# Patient Record
Sex: Female | Born: 1975
Health system: Southern US, Community
[De-identification: ages and names within clinical notes are randomized; demographics above are authoritative.]

## PROBLEM LIST (undated history)

## (undated) DIAGNOSIS — R413 Other amnesia: Secondary | ICD-10-CM

## (undated) DIAGNOSIS — R7303 Prediabetes: Secondary | ICD-10-CM

## (undated) DIAGNOSIS — S069X9A Unspecified intracranial injury with loss of consciousness of unspecified duration, initial encounter: Secondary | ICD-10-CM

## (undated) DIAGNOSIS — R51 Headache: Secondary | ICD-10-CM

## (undated) DIAGNOSIS — Z973 Presence of spectacles and contact lenses: Secondary | ICD-10-CM

## (undated) DIAGNOSIS — J45909 Unspecified asthma, uncomplicated: Secondary | ICD-10-CM

## (undated) DIAGNOSIS — R519 Headache, unspecified: Secondary | ICD-10-CM

## (undated) DIAGNOSIS — B977 Papillomavirus as the cause of diseases classified elsewhere: Secondary | ICD-10-CM

## (undated) DIAGNOSIS — K589 Irritable bowel syndrome without diarrhea: Secondary | ICD-10-CM

## (undated) DIAGNOSIS — J302 Other seasonal allergic rhinitis: Secondary | ICD-10-CM

## (undated) DIAGNOSIS — Z87820 Personal history of traumatic brain injury: Secondary | ICD-10-CM

## (undated) DIAGNOSIS — G8929 Other chronic pain: Secondary | ICD-10-CM

## (undated) DIAGNOSIS — Z9289 Personal history of other medical treatment: Secondary | ICD-10-CM

## (undated) DIAGNOSIS — M2550 Pain in unspecified joint: Secondary | ICD-10-CM

## (undated) DIAGNOSIS — Z972 Presence of dental prosthetic device (complete) (partial): Secondary | ICD-10-CM

## (undated) DIAGNOSIS — Z95828 Presence of other vascular implants and grafts: Secondary | ICD-10-CM

## (undated) DIAGNOSIS — Z01419 Encounter for gynecological examination (general) (routine) without abnormal findings: Secondary | ICD-10-CM

## (undated) HISTORY — DX: Headache, unspecified: R51.9

## (undated) HISTORY — DX: Other amnesia: R41.3

## (undated) HISTORY — DX: Papillomavirus as the cause of diseases classified elsewhere: B97.7

## (undated) HISTORY — PX: KNEE SURGERY: SHX244

## (undated) HISTORY — PX: COLONOSCOPY: SHX174

## (undated) HISTORY — DX: Personal history of other medical treatment: Z92.89

## (undated) HISTORY — PX: FINGER SURGERY: SHX640

## (undated) HISTORY — DX: Presence of dental prosthetic device (complete) (partial): Z97.2

## (undated) HISTORY — DX: Headache: R51

## (undated) HISTORY — DX: Other chronic pain: G89.29

## (undated) HISTORY — DX: Unspecified asthma, uncomplicated: J45.909

## (undated) HISTORY — DX: Pain in unspecified joint: M25.50

## (undated) HISTORY — DX: Unspecified intracranial injury with loss of consciousness of unspecified duration, initial encounter: S06.9X9A

## (undated) HISTORY — DX: Presence of other vascular implants and grafts: Z95.828

## (undated) HISTORY — DX: Other seasonal allergic rhinitis: J30.2

## (undated) HISTORY — DX: Irritable bowel syndrome, unspecified: K58.9

## (undated) HISTORY — DX: Encounter for gynecological examination (general) (routine) without abnormal findings: Z01.419

## (undated) HISTORY — DX: Personal history of traumatic brain injury: Z87.820

## (undated) HISTORY — DX: Presence of spectacles and contact lenses: Z97.3

---

## 2005-09-14 DIAGNOSIS — S069XAA Unspecified intracranial injury with loss of consciousness status unknown, initial encounter: Secondary | ICD-10-CM

## 2005-09-14 DIAGNOSIS — S069X9A Unspecified intracranial injury with loss of consciousness of unspecified duration, initial encounter: Secondary | ICD-10-CM

## 2005-09-14 DIAGNOSIS — Z95828 Presence of other vascular implants and grafts: Secondary | ICD-10-CM

## 2005-09-14 HISTORY — DX: Unspecified intracranial injury with loss of consciousness status unknown, initial encounter: S06.9XAA

## 2005-09-14 HISTORY — DX: Unspecified intracranial injury with loss of consciousness of unspecified duration, initial encounter: S06.9X9A

## 2005-09-14 HISTORY — DX: Presence of other vascular implants and grafts: Z95.828

## 2007-02-21 ENCOUNTER — Other Ambulatory Visit: Admission: RE | Admit: 2007-02-21 | Discharge: 2007-02-21 | Payer: Self-pay | Admitting: Obstetrics and Gynecology

## 2007-07-15 ENCOUNTER — Encounter: Admission: RE | Admit: 2007-07-15 | Discharge: 2007-07-15 | Payer: Self-pay | Admitting: Family Medicine

## 2007-08-31 ENCOUNTER — Emergency Department (HOSPITAL_COMMUNITY): Admission: EM | Admit: 2007-08-31 | Discharge: 2007-08-31 | Payer: Self-pay | Admitting: *Deleted

## 2008-04-27 ENCOUNTER — Other Ambulatory Visit: Admission: RE | Admit: 2008-04-27 | Discharge: 2008-04-27 | Payer: Self-pay | Admitting: Obstetrics and Gynecology

## 2008-11-05 ENCOUNTER — Encounter: Admission: RE | Admit: 2008-11-05 | Discharge: 2009-02-03 | Payer: Self-pay | Admitting: Psychology

## 2009-05-10 ENCOUNTER — Other Ambulatory Visit: Admission: RE | Admit: 2009-05-10 | Discharge: 2009-05-10 | Payer: Self-pay | Admitting: Obstetrics and Gynecology

## 2009-06-29 ENCOUNTER — Emergency Department (HOSPITAL_COMMUNITY): Admission: EM | Admit: 2009-06-29 | Discharge: 2009-06-29 | Payer: Self-pay | Admitting: Emergency Medicine

## 2009-10-19 ENCOUNTER — Inpatient Hospital Stay (HOSPITAL_COMMUNITY): Admission: AD | Admit: 2009-10-19 | Discharge: 2009-10-19 | Payer: Self-pay | Admitting: Obstetrics and Gynecology

## 2009-11-23 ENCOUNTER — Encounter (INDEPENDENT_AMBULATORY_CARE_PROVIDER_SITE_OTHER): Payer: Self-pay | Admitting: Obstetrics and Gynecology

## 2009-11-23 ENCOUNTER — Inpatient Hospital Stay (HOSPITAL_COMMUNITY): Admission: AD | Admit: 2009-11-23 | Discharge: 2009-11-26 | Payer: Self-pay | Admitting: Obstetrics and Gynecology

## 2009-11-23 ENCOUNTER — Ambulatory Visit: Payer: Self-pay | Admitting: Obstetrics & Gynecology

## 2010-05-21 ENCOUNTER — Other Ambulatory Visit: Admission: RE | Admit: 2010-05-21 | Discharge: 2010-05-21 | Payer: Self-pay | Admitting: Obstetrics and Gynecology

## 2010-12-04 LAB — URINALYSIS, ROUTINE W REFLEX MICROSCOPIC
Bilirubin Urine: NEGATIVE
Ketones, ur: >80 mg/dL — AB

## 2010-12-08 LAB — CBC
HCT: 30 % — ABNORMAL LOW (ref 36.0–46.0)
HCT: 37 % (ref 36.0–46.0)
Hemoglobin: 12.3 g/dL (ref 12.0–15.0)
MCHC: 33.1 g/dL (ref 30.0–36.0)
MCHC: 33.2 g/dL (ref 30.0–36.0)
MCV: 91.7 fL (ref 78.0–100.0)
MCV: 92.2 fL (ref 78.0–100.0)
MCV: 92.3 fL (ref 78.0–100.0)
Platelets: 137 10*3/uL — ABNORMAL LOW (ref 150–400)
Platelets: 161 10*3/uL (ref 150–400)
RBC: 3.26 MIL/uL — ABNORMAL LOW (ref 3.87–5.11)
RBC: 4.04 MIL/uL (ref 3.87–5.11)
RDW: 15.2 % (ref 11.5–15.5)
RDW: 15.2 % (ref 11.5–15.5)
RDW: 15.5 % (ref 11.5–15.5)
WBC: 10.3 10*3/uL (ref 4.0–10.5)
WBC: 10.5 10*3/uL (ref 4.0–10.5)
WBC: 7.2 10*3/uL (ref 4.0–10.5)

## 2010-12-08 LAB — RPR: RPR Ser Ql: NONREACTIVE

## 2010-12-18 LAB — URINALYSIS, ROUTINE W REFLEX MICROSCOPIC
Glucose, UA: NEGATIVE mg/dL
Hgb urine dipstick: NEGATIVE
Ketones, ur: 40 mg/dL — AB
Protein, ur: NEGATIVE mg/dL
Urobilinogen, UA: 0.2 mg/dL (ref 0.0–1.0)
pH: 5 (ref 5.0–8.0)

## 2010-12-18 LAB — POCT I-STAT, CHEM 8
Chloride: 104 mEq/L (ref 96–112)
Creatinine, Ser: 0.8 mg/dL (ref 0.4–1.2)
Glucose, Bld: 82 mg/dL (ref 70–99)
Hemoglobin: 11.6 g/dL — ABNORMAL LOW (ref 12.0–15.0)
Sodium: 136 mEq/L (ref 135–145)

## 2011-02-05 ENCOUNTER — Other Ambulatory Visit: Payer: Self-pay | Admitting: Family Medicine

## 2011-02-05 DIAGNOSIS — T148XXA Other injury of unspecified body region, initial encounter: Secondary | ICD-10-CM

## 2011-02-06 ENCOUNTER — Other Ambulatory Visit: Payer: Self-pay

## 2011-02-10 ENCOUNTER — Ambulatory Visit
Admission: RE | Admit: 2011-02-10 | Discharge: 2011-02-10 | Disposition: A | Discharge status: Home / Self Care | Payer: 59 | Source: Ambulatory Visit | Attending: Family Medicine | Admitting: Family Medicine

## 2011-02-10 DIAGNOSIS — T148XXA Other injury of unspecified body region, initial encounter: Secondary | ICD-10-CM

## 2011-07-21 ENCOUNTER — Other Ambulatory Visit: Payer: Self-pay | Admitting: Obstetrics and Gynecology

## 2011-07-21 ENCOUNTER — Other Ambulatory Visit (HOSPITAL_COMMUNITY)
Admission: RE | Admit: 2011-07-21 | Discharge: 2011-07-21 | Disposition: A | Discharge status: Home / Self Care | Payer: 59 | Source: Ambulatory Visit | Attending: Obstetrics and Gynecology | Admitting: Obstetrics and Gynecology

## 2011-07-21 DIAGNOSIS — Z01419 Encounter for gynecological examination (general) (routine) without abnormal findings: Secondary | ICD-10-CM | POA: Insufficient documentation

## 2011-07-22 ENCOUNTER — Other Ambulatory Visit: Payer: Self-pay | Admitting: Obstetrics and Gynecology

## 2011-07-22 DIAGNOSIS — N6320 Unspecified lump in the left breast, unspecified quadrant: Secondary | ICD-10-CM

## 2011-07-22 DIAGNOSIS — N644 Mastodynia: Secondary | ICD-10-CM

## 2011-08-07 ENCOUNTER — Ambulatory Visit
Admission: RE | Admit: 2011-08-07 | Discharge: 2011-08-07 | Disposition: A | Discharge status: Home / Self Care | Payer: 59 | Source: Ambulatory Visit | Attending: Obstetrics and Gynecology | Admitting: Obstetrics and Gynecology

## 2011-08-07 DIAGNOSIS — N6320 Unspecified lump in the left breast, unspecified quadrant: Secondary | ICD-10-CM

## 2011-08-07 DIAGNOSIS — N644 Mastodynia: Secondary | ICD-10-CM

## 2012-01-20 ENCOUNTER — Ambulatory Visit (INDEPENDENT_AMBULATORY_CARE_PROVIDER_SITE_OTHER): Payer: 59 | Admitting: Family Medicine

## 2012-01-20 VITALS — BP 119/76 | HR 77 | Temp 98.5°F | Resp 16 | Ht 65.0 in | Wt 168.0 lb

## 2012-01-20 DIAGNOSIS — Z23 Encounter for immunization: Secondary | ICD-10-CM

## 2012-01-20 DIAGNOSIS — S61209A Unspecified open wound of unspecified finger without damage to nail, initial encounter: Secondary | ICD-10-CM

## 2012-01-20 NOTE — Patient Instructions (Signed)
Keep it clean and dry.  Tetanus shot (TDaP) given  Do not eat after midnight  Go to Dr. Glenna Durand office at [facility] at [facility] above the [employer].

## 2012-01-20 NOTE — Progress Notes (Signed)
Subjective: Patient was cutting chicken about 24 hours ago and cut her hand. She has a slice on the DIP joint at the extensor surface. It is clean and superficial. She is a full-time housewife. Last tetanus shot was 7 years ago.  Objective: 1.5 CM laceration  dorsum left index finger at DIP joint. The tip of the finger remains slightly flexed. She can weakly extend it, and has good flexion. It is not red or inflamed.  Assessment: Laceration left index finger, with damage to extensor tendon.   Plan: Due to the definite flexion deformity we will need to refer to hand ortho Spoke with Dr. Melvyn Novas.  She is to go to his office NPO at 9 AM. Tomorrow.

## 2012-05-19 ENCOUNTER — Encounter: Payer: Self-pay | Admitting: Medical

## 2012-05-19 ENCOUNTER — Ambulatory Visit (INDEPENDENT_AMBULATORY_CARE_PROVIDER_SITE_OTHER): Payer: 59 | Admitting: Medical

## 2012-05-19 VITALS — BP 102/80 | HR 68 | Temp 98.7°F | Resp 16 | Ht 64.0 in | Wt 160.0 lb

## 2012-05-19 DIAGNOSIS — D689 Coagulation defect, unspecified: Secondary | ICD-10-CM

## 2012-05-19 DIAGNOSIS — Z113 Encounter for screening for infections with a predominantly sexual mode of transmission: Secondary | ICD-10-CM

## 2012-05-19 DIAGNOSIS — S069X9A Unspecified intracranial injury with loss of consciousness of unspecified duration, initial encounter: Secondary | ICD-10-CM

## 2012-05-19 DIAGNOSIS — R413 Other amnesia: Secondary | ICD-10-CM

## 2012-05-19 DIAGNOSIS — Z23 Encounter for immunization: Secondary | ICD-10-CM

## 2012-05-19 DIAGNOSIS — Z0000 Encounter for general adult medical examination without abnormal findings: Secondary | ICD-10-CM

## 2012-05-19 DIAGNOSIS — F88 Other disorders of psychological development: Secondary | ICD-10-CM

## 2012-05-19 DIAGNOSIS — R209 Unspecified disturbances of skin sensation: Secondary | ICD-10-CM

## 2012-05-19 DIAGNOSIS — R252 Cramp and spasm: Secondary | ICD-10-CM

## 2012-05-19 LAB — POCT URINALYSIS DIPSTICK
Leukocytes, UA: NEGATIVE
Protein, UA: NEGATIVE
Spec Grav, UA: 1.02
Urobilinogen, UA: NEGATIVE

## 2012-05-19 LAB — CBC WITH DIFFERENTIAL/PLATELET
Basophils Absolute: 0 10*3/uL (ref 0.0–0.1)
Eosinophils Relative: 2 % (ref 0–5)
Lymphocytes Relative: 30 % (ref 12–46)
MCV: 87.9 fL (ref 78.0–100.0)
Neutrophils Relative %: 63 % (ref 43–77)
Platelets: 199 10*3/uL (ref 150–400)
RDW: 15 % (ref 11.5–15.5)
WBC: 7.8 10*3/uL (ref 4.0–10.5)
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
## 2012-05-20 LAB — COMPREHENSIVE METABOLIC PANEL
ALT: 16 U/L (ref 0–35)
Albumin: 4.7 g/dL (ref 3.5–5.2)
CO2: 28 mEq/L (ref 19–32)
Calcium: 9.5 mg/dL (ref 8.4–10.5)
Chloride: 104 mEq/L (ref 96–112)
Glucose, Bld: 80 mg/dL (ref 70–99)
Potassium: 4.1 mEq/L (ref 3.5–5.3)
Sodium: 140 mEq/L (ref 135–145)
Total Bilirubin: 0.4 mg/dL (ref 0.3–1.2)
Total Protein: 7.3 g/dL (ref 6.0–8.3)

## 2012-05-20 LAB — LIPID PANEL
LDL Cholesterol: 81 mg/dL (ref 0–99)
Triglycerides: 44 mg/dL (ref <150)

## 2012-05-20 LAB — GC/CHLAMYDIA PROBE AMP, URINE
Chlamydia, Swab/Urine, PCR: NEGATIVE
GC Probe Amp, Urine: NEGATIVE

## 2012-05-20 LAB — APTT: aPTT: 32 seconds (ref 24–37)

## 2012-05-20 LAB — HIV ANTIBODY (ROUTINE TESTING W REFLEX): HIV: NONREACTIVE

## 2012-05-20 LAB — TSH: TSH: 2.532 u[IU]/mL (ref 0.350–4.500)

## 2012-09-12 ENCOUNTER — Other Ambulatory Visit: Payer: Self-pay | Admitting: Obstetrics and Gynecology

## 2012-09-12 ENCOUNTER — Other Ambulatory Visit (HOSPITAL_COMMUNITY)
Admission: RE | Admit: 2012-09-12 | Discharge: 2012-09-12 | Disposition: A | Discharge status: Home / Self Care | Payer: 59 | Source: Ambulatory Visit | Attending: Obstetrics and Gynecology | Admitting: Obstetrics and Gynecology

## 2012-09-12 DIAGNOSIS — Z1151 Encounter for screening for human papillomavirus (HPV): Secondary | ICD-10-CM | POA: Insufficient documentation

## 2012-09-12 DIAGNOSIS — Z01419 Encounter for gynecological examination (general) (routine) without abnormal findings: Secondary | ICD-10-CM | POA: Insufficient documentation

## 2013-09-14 DIAGNOSIS — Z01419 Encounter for gynecological examination (general) (routine) without abnormal findings: Secondary | ICD-10-CM

## 2013-09-14 HISTORY — DX: Encounter for gynecological examination (general) (routine) without abnormal findings: Z01.419

## 2013-10-04 ENCOUNTER — Encounter: Payer: Self-pay | Admitting: Medical

## 2013-10-04 ENCOUNTER — Ambulatory Visit (INDEPENDENT_AMBULATORY_CARE_PROVIDER_SITE_OTHER): Payer: 59 | Admitting: Medical

## 2013-10-04 VITALS — BP 108/76 | HR 75 | Temp 98.2°F | Resp 16 | Ht 64.0 in | Wt 163.0 lb

## 2013-10-04 DIAGNOSIS — L659 Nonscarring hair loss, unspecified: Secondary | ICD-10-CM

## 2013-10-04 DIAGNOSIS — R635 Abnormal weight gain: Secondary | ICD-10-CM

## 2013-10-04 DIAGNOSIS — Q742 Other congenital malformations of lower limb(s), including pelvic girdle: Secondary | ICD-10-CM

## 2013-10-04 DIAGNOSIS — R5381 Other malaise: Secondary | ICD-10-CM

## 2013-10-04 DIAGNOSIS — R5383 Other fatigue: Secondary | ICD-10-CM

## 2013-10-04 LAB — HEMOGLOBIN A1C
HEMOGLOBIN A1C: 5.5 % (ref <5.7)
Mean Plasma Glucose: 111 mg/dL (ref <117)

## 2013-10-04 LAB — CBC
HCT: 39.4 % (ref 36.0–46.0)
Hemoglobin: 13 g/dL (ref 12.0–15.0)
MCH: 29.1 pg (ref 26.0–34.0)
MCHC: 33 g/dL (ref 30.0–36.0)
MCV: 88.3 fL (ref 78.0–100.0)
PLATELETS: 193 10*3/uL (ref 150–400)
RBC: 4.46 MIL/uL (ref 3.87–5.11)
RDW: 14.7 % (ref 11.5–15.5)
WBC: 8.7 10*3/uL (ref 4.0–10.5)

## 2013-10-04 NOTE — Progress Notes (Signed)
                                                             Subjective:  Tiffany Hurley is a 38 y.o. female who presents for concerns about weight gain and hair thinning. Her last visit was over year ago for physical.  She notes that she was down to 140 pounds 6 months ago and she was exercising eating healthy, however she is being back up to 163 pounds.  She feels like something is off hormonally, although she does admit to not exercising and eating more junk food of late. She also reports hair thinning, ongoing nipple discharge since the birth of her son.  She does have followup with her gynecologist in the next 2 weeks. She denies breast pain, no rash, no blood or pus in the breast discharge.  She does feel as though she is under stress. 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 She also has questions about her left toe anomaly. She had prior surgery for the left toe and since then has had thickening deformity.  No other aggravating or relieving factors.    No other c/o.  The following portions of the patient's history were reviewed and updated as appropriate: allergies, current medications, past family history, past medical history, past social history, past surgical history and problem list.  ROS Otherwise as in subjective above  Objective: Physical Exam  BP 108/76  Pulse 75  Temp(Src) 98.2 F (36.8 C)  Resp 16  Ht 5\' 4"  (1.626 m)  Wt 163 lb (73.936 kg)  BMI 27.97 kg/m2  SpO2 98%   General appearence: alert, no distress, WD/WN HEENT: normocephalic, sclerae anicteric, conjunctiva pink and moist, TMs pearly, nares patent, no discharge or erythema, pharynx normal Oral cavity: MMM, no lesions Neck: supple, no lymphadenopathy, no thyromegaly, no masses Heart: RRR, normal S1, S2, no murmurs Lungs: CTA bilaterally, no wheezes, rhonchi, or rales Abdomen: +bs, soft, non tender, non distended, no masses, no hepatomegaly, no splenomegaly Pulses: 2+  radial pulses, 2+ pedal pulses, normal cap refill MSK: left 5th toe with thickened volar surface, calloused tissue Ext: no edema   Assessment: Encounter Diagnoses  Name Primary?  . Weight gain, abnormal Yes  . Hair thinning   . Other malaise and fatigue   . Toe anomaly     Plan: We discussed her concerns.  Weight gain likely just due to lack of exercise and worse diet of late.  Hair thinning could be attributable to stress.  We discussed other possible causes.  We will check labs at her request today. I recommend she get back to exercising rarely, eating healthy and try to decrease down to the weight she was 6 months ago.  I recommend she call back to get recheck with her podiatrist regarding the toe anomaly.  Follow up: pending labs

## 2013-10-05 ENCOUNTER — Telehealth: Payer: Self-pay | Admitting: Internal Medicine

## 2013-10-05 LAB — COMPREHENSIVE METABOLIC PANEL
ALBUMIN: 4.7 g/dL (ref 3.5–5.2)
ALT: 15 U/L (ref 0–35)
AST: 17 U/L (ref 0–37)
Alkaline Phosphatase: 42 U/L (ref 39–117)
BUN: 16 mg/dL (ref 6–23)
CALCIUM: 9.7 mg/dL (ref 8.4–10.5)
CHLORIDE: 106 meq/L (ref 96–112)
CO2: 28 meq/L (ref 19–32)
CREATININE: 0.9 mg/dL (ref 0.50–1.10)
Glucose, Bld: 69 mg/dL — ABNORMAL LOW (ref 70–99)
POTASSIUM: 4.6 meq/L (ref 3.5–5.3)
Sodium: 141 mEq/L (ref 135–145)
Total Bilirubin: 0.3 mg/dL (ref 0.3–1.2)
Total Protein: 7.4 g/dL (ref 6.0–8.3)

## 2013-10-05 LAB — TSH: TSH: 2.681 u[IU]/mL (ref 0.350–4.500)

## 2013-10-05 LAB — T4, FREE: Free T4: 1.05 ng/dL (ref 0.80–1.80)

## 2013-10-05 LAB — PROLACTIN: Prolactin: 9.6 ng/mL

## 2013-10-05 NOTE — Telephone Encounter (Signed)
Pt notified that she can wait until next year for labs

## 2013-10-05 NOTE — Telephone Encounter (Signed)
We can skip a year as her lasts lipid panel was completely normal

## 2013-10-05 NOTE — Telephone Encounter (Signed)
Pt wants to know when she needs to come in to get her lipids checks. She was unable to get it done yesterday. Or does she just need to wait another year? Please advise pt

## 2013-10-24 ENCOUNTER — Other Ambulatory Visit: Payer: Self-pay | Admitting: Obstetrics and Gynecology

## 2013-10-24 ENCOUNTER — Other Ambulatory Visit (HOSPITAL_COMMUNITY)
Admission: RE | Admit: 2013-10-24 | Discharge: 2013-10-24 | Disposition: A | Discharge status: Home / Self Care | Payer: 59 | Source: Ambulatory Visit | Attending: Obstetrics and Gynecology | Admitting: Obstetrics and Gynecology

## 2013-10-24 DIAGNOSIS — Z01419 Encounter for gynecological examination (general) (routine) without abnormal findings: Secondary | ICD-10-CM | POA: Insufficient documentation

## 2013-10-24 DIAGNOSIS — Z113 Encounter for screening for infections with a predominantly sexual mode of transmission: Secondary | ICD-10-CM | POA: Insufficient documentation

## 2013-12-09 ENCOUNTER — Ambulatory Visit (INDEPENDENT_AMBULATORY_CARE_PROVIDER_SITE_OTHER): Payer: 59 | Admitting: Family Medicine

## 2013-12-09 ENCOUNTER — Ambulatory Visit: Payer: 59

## 2013-12-09 VITALS — BP 132/80 | HR 68 | Temp 97.9°F | Resp 16 | Ht 64.0 in | Wt 170.0 lb

## 2013-12-09 DIAGNOSIS — M542 Cervicalgia: Secondary | ICD-10-CM

## 2013-12-09 DIAGNOSIS — M791 Myalgia, unspecified site: Secondary | ICD-10-CM

## 2013-12-09 DIAGNOSIS — M25519 Pain in unspecified shoulder: Secondary | ICD-10-CM

## 2013-12-09 DIAGNOSIS — IMO0001 Reserved for inherently not codable concepts without codable children: Secondary | ICD-10-CM

## 2013-12-09 DIAGNOSIS — R252 Cramp and spasm: Secondary | ICD-10-CM

## 2013-12-09 LAB — CALCIUM: Calcium: 9.8 mg/dL (ref 8.4–10.5)

## 2013-12-09 LAB — POCT SEDIMENTATION RATE: POCT SED RATE: 12 mm/h (ref 0–22)

## 2013-12-09 LAB — CK: Total CK: 192 U/L — ABNORMAL HIGH (ref 7–177)

## 2013-12-09 MED ORDER — DICLOFENAC SODIUM 75 MG PO TBEC: 75.0000 mg | DELAYED_RELEASE_TABLET | Freq: Two times a day (BID) | ORAL | Status: DC

## 2013-12-09 MED ORDER — METAXALONE 800 MG PO TABS: ORAL_TABLET | ORAL | Status: DC
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
## 2013-12-09 NOTE — Patient Instructions (Addendum)
Take diclofenac one twice daily for pain and inflammation. Take with food  Take Skelaxin muscle relaxants one half to one 3 times daily if needed for muscle tightness or spasm  If not improving over the next couple of weeks we will need to recheck you  I will let you know if the radiologist sees anything differently in the films. I will also let you know the results of the labs in a few days

## 2013-12-22 ENCOUNTER — Ambulatory Visit (INDEPENDENT_AMBULATORY_CARE_PROVIDER_SITE_OTHER): Payer: 59 | Admitting: Family Medicine

## 2013-12-22 VITALS — BP 100/70 | HR 80

## 2013-12-22 DIAGNOSIS — M62838 Other muscle spasm: Secondary | ICD-10-CM

## 2013-12-22 NOTE — Progress Notes (Signed)
   Subjective:    Patient ID: Tiffany Hurley, female    DOB: 1976-08-25, 38 y.o.   MRN: [id number]  HPI  Ms. Tiffany Hurley is a 38 yo female who presents today due to ongoing muscle spasms.   Around 3 months ago the patient began to experience muscle cramps in varying muscle groups in her upper or lower extremity. The spasms feels like a sharp tight cramping sensation. Then, one week ago the patient presented to urgent care because the spasms had begun to occur in her neck and shoulder. At that time she was given two medications, one for her shoulder for pain and a muscle relaxant. Since beginning the muscle relaxant her spasms in her neck have worsened and increased in frequency and are now a nightly occurence. These spasms awaken her from sleep and gradually resolve with massage over the course of a few minutes. The spasms have also occurred when she is awake and in those cases she can feel a spasm coming on.  The patient has a long history of muscle cramps which began at the age of 38. However, from the age of 38 until 3 months ago the cramps had always been isolated to her legs and only occurred rarely. Concurrent with the changes in her symptoms, three months ago the patient and her husband experienced a particularly stressful period. Subsequent to this the patient also experienced a period of "feeling very down in the dumps." The patient notes that it is possible that some of her symptoms may be exacerbated by the stresses in her life and her stress as a mother.   The patient denies an abnormally colored urine, headaches, changes in vision, weakness, numbness, tingling, changes in mood, behavior, sleep or symptoms of lethargy or fatigue. The patient has had no major changes in diet or medications. Her cramps get a little better with movement and massage. The cramps are not associated with dehydration or her menses and the patient drinks around 80oz of water a day.   Review of Systems is negative  except per HPI.     Objective:   Physical Exam  Constitutional: Patient is oriented to person, place, and time and well-developed, well-nourished, and in no distress.  HENT: Head: Normocephalic and atraumatic. Mouth/Throat: Oropharynx is clear and moist.  Eyes: Conjunctivae and EOM are normal. Pupils are equal, round, and reactive to light.  Neck: Normal range of motion. Neck supple. No point tendernes present.  Cardiovascular: Normal rate, regular rhythm.  Pulmonary/Chest: Effort normal and breath sounds normal. Neurological: He is alert and oriented to person, place, and time.  Reflex Scores: Biceps, Tricep and Patellar reflexes were all 2+ and equal bilaterally.  Skin: Skin is warm and dry. No rash noted.  Psychiatric: Affect normal.     Assessment & Plan:  Muscle spasms of head and/or neck  discussed various options with her. Recommended a good multivitamin as well as extra vitamin D, taking time out for herself, exercises. She continues to have difficulty she is to return here for further evaluation and possible referral to rheumatology

## 2013-12-22 NOTE — Progress Notes (Signed)
   Subjective:    Patient ID: Tiffany Hurley, female    DOB: 02-03-1976, 38 y.o.   MRN: [id number]  HPI  Ms. [name] is a 38 yo female who presents today due to ongoing muscle spasms.   Around 3 months ago the patient began to experience muscle cramps in varying muscle groups in her upper or lower extremity. The spasms feels like a sharp tight cramping sensation. Then, one week ago the patient presented to urgent care because the spasms had begun to occur in her neck and shoulder. At that time she was given two medications, one for her shoulder for pain and a muscle relaxant. Since beginning the muscle relaxant her spasms in her neck have worsened and increased in frequency and are now a nightly occurence. These spasms awaken her from sleep and gradually resolve with massage over the course of a few minutes. The spasms have also occurred when she is awake and in those cases she can feel a spasm coming on.  The patient has a long history of muscle cramps which began at the age of 38. However, from the age of 38 until 3 months ago the cramps had always been isolated to her legs and only occurred rarely. Concurrent with the changes in her symptoms, three months ago the patient and her husband experienced a particularly stressful period. Subsequent to this the patient also experienced a period of "feeling very down in the dumps." The patient notes that it is possible that some of her symptoms may be exacerbated by the stresses in her life and her stress as a mother.   The patient denies an abnormally colored urine, headaches, changes in vision, weakness, numbness, tingling, changes in mood, behavior, sleep or symptoms of lethargy or fatigue. The patient has had no major changes in diet or medications. Her cramps get a little better with movement and massage. The cramps are not associated with dehydration or her menses and the patient drinks around 80oz of water a day.   Review of Systems is negative  except per HPI.     Objective:   Physical Exam  Constitutional: Patient is oriented to person, place, and time and well-developed, well-nourished, and in no distress.  HENT: Head: Normocephalic and atraumatic. Mouth/Throat: Oropharynx is clear and moist.  Eyes: Conjunctivae and EOM are normal. Pupils are equal, round, and reactive to light.  Neck: Normal range of motion. Neck supple. No point tendernes present.  Cardiovascular: Normal rate, regular rhythm.  Pulmonary/Chest: Effort normal and breath sounds normal. Neurological: He is alert and oriented to person, place, and time.  Reflex Scores: Biceps, Tricep and Patellar reflexes were all 2+ and equal bilaterally.  Skin: Skin is warm and dry. No rash noted.  Psychiatric: Affect normal.     Assessment & Plan:

## 2013-12-22 NOTE — Progress Notes (Deleted)
   Subjective:    Patient ID: Tiffany Hurley, female    DOB: March 19, 1976, 38 y.o.   MRN: [id number]  HPI    Review of Systems     Objective:   Physical Exam        Assessment & Plan:

## 2013-12-27 ENCOUNTER — Ambulatory Visit: Payer: 59 | Admitting: Medical

## 2013-12-29 ENCOUNTER — Ambulatory Visit: Payer: 59 | Admitting: Family Medicine

## 2014-04-18 ENCOUNTER — Ambulatory Visit
Admission: RE | Admit: 2014-04-18 | Discharge: 2014-04-18 | Disposition: A | Discharge status: Home / Self Care | Payer: 59 | Source: Ambulatory Visit | Attending: Medical | Admitting: Medical

## 2014-04-18 ENCOUNTER — Encounter: Payer: Self-pay | Admitting: Medical

## 2014-04-18 ENCOUNTER — Ambulatory Visit (INDEPENDENT_AMBULATORY_CARE_PROVIDER_SITE_OTHER): Payer: 59 | Admitting: Medical

## 2014-04-18 VITALS — BP 102/70 | HR 74 | Temp 99.2°F | Resp 16 | Wt 180.0 lb

## 2014-04-18 DIAGNOSIS — M2560 Stiffness of unspecified joint, not elsewhere classified: Secondary | ICD-10-CM

## 2014-04-18 DIAGNOSIS — M79644 Pain in right finger(s): Secondary | ICD-10-CM

## 2014-04-18 DIAGNOSIS — M25441 Effusion, right hand: Secondary | ICD-10-CM

## 2014-04-18 DIAGNOSIS — Y92009 Unspecified place in unspecified non-institutional (private) residence as the place of occurrence of the external cause: Secondary | ICD-10-CM

## 2014-04-18 DIAGNOSIS — R29898 Other symptoms and signs involving the musculoskeletal system: Secondary | ICD-10-CM

## 2014-04-18 DIAGNOSIS — M25449 Effusion, unspecified hand: Secondary | ICD-10-CM

## 2014-04-18 DIAGNOSIS — W19XXXA Unspecified fall, initial encounter: Secondary | ICD-10-CM

## 2014-04-18 DIAGNOSIS — M79609 Pain in unspecified limb: Secondary | ICD-10-CM

## 2014-04-18 LAB — CBC WITH DIFFERENTIAL/PLATELET
BASOS ABS: 0 10*3/uL (ref 0.0–0.1)
Basophils Relative: 0 % (ref 0–1)
Eosinophils Absolute: 0.2 10*3/uL (ref 0.0–0.7)
Eosinophils Relative: 3 % (ref 0–5)
HCT: 35.6 % — ABNORMAL LOW (ref 36.0–46.0)
Hemoglobin: 11.8 g/dL — ABNORMAL LOW (ref 12.0–15.0)
Lymphocytes Relative: 29 % (ref 12–46)
Lymphs Abs: 2.3 10*3/uL (ref 0.7–4.0)
MCH: 28.7 pg (ref 26.0–34.0)
MCHC: 33.1 g/dL (ref 30.0–36.0)
MCV: 86.6 fL (ref 78.0–100.0)
Monocytes Absolute: 0.4 10*3/uL (ref 0.1–1.0)
Monocytes Relative: 5 % (ref 3–12)
Neutro Abs: 4.9 10*3/uL (ref 1.7–7.7)
Neutrophils Relative %: 63 % (ref 43–77)
PLATELETS: 182 10*3/uL (ref 150–400)
RBC: 4.11 MIL/uL (ref 3.87–5.11)
RDW: 14.7 % (ref 11.5–15.5)
WBC: 7.8 10*3/uL (ref 4.0–10.5)

## 2014-04-18 LAB — SEDIMENTATION RATE: Sed Rate: 11 mm/hr (ref 0–22)

## 2014-04-18 LAB — URIC ACID: URIC ACID, SERUM: 4.3 mg/dL (ref 2.4–7.0)

## 2014-04-18 NOTE — Patient Instructions (Signed)
  Thank you for giving me the opportunity to serve you today.    Your diagnosis today includes: Encounter Diagnoses  Name Primary?  . Finger joint swelling, right Yes  . Finger pain, right   . Fall at home, initial encounter   . Range of motion deficit      Specific recommendations today include:  The cause of the swelling and pain is unclear, could be inflammation, could be gout, could be infection  I recommend you use ice pack 20 minutes at a time to the hand and elevate the hand  I recommend you use an over-the-counter hand/wrist splint for the next few days  Go for x-ray, we will call with x-ray and lab results  You may use over-the-counter ibuprofen, 4 tablets 3 times daily for pain and inflammation  If your white count is elevated, I may put you on antibiotic for possible infection  If worsening swelling, redness, fever, then let me know right away  Return pendign xray/labs.

## 2014-04-18 NOTE — Progress Notes (Signed)
Subjective: Here for right hand swelling and pain.  3 days ago awoke with painful Right middle finger that didn't seem to be able to move.  By the next day started swelling.  Now right hand is swelling more.  She does note that she fell August 2, tripped over stuff that was in 1 of her rooms in her house. She didn't recall pain or an obvious injury then, but thinking back it is the only thing that has been different. She denies any other injury, trauma, puncture wound, or bite.  She denies history of similar pain and swelling of the joint. She denies history of gout or reactive arthritis.  Denies fever, no nausea, no vomiting.  Denies vision changes.  Denies urinary changes, no concern for STD.  No other aggravating or relieving factors. No other complaint.  Review of Systems Constitutional: -fever, -chills, -sweats, -unexpected weight change,-fatigue ENT: -runny nose, -ear pain, -sore throat Cardiology:  -chest pain, -palpitations, -edema Respiratory: -cough, -shortness of breath, -wheezing Gastroenterology: -abdominal pain, -nausea, -vomiting, -diarrhea, -constipation  Hematology: -bleeding or bruising problems Ophthalmology: -vision changes Urology: -dysuria, -difficulty urinating, -hematuria, -urinary frequency, -urgency Neurology: -headache, -weakness, -tingling, -numbness     Objective: Gen:wd, wn, nad Skin: no erythema or ecchyomsmis, but the right hand is warm over the area of swelling MSK: Right middle finger with swelling at the MCP, decreased range of motion of that finger throughout, tender to touch all along the middle finger and 3rd  Metacarpal, pain in same finger with wrist ROM, but wrist, hand, arm otherwise nontender.  No other deformity or abnormality noted Hand/fingers otherwise neurovascularly intact    Assessment: Encounter Diagnoses  Name Primary?  . Finger joint swelling, right Yes  . Finger pain, right   . Fall at home, initial encounter   . Range of motion  deficit    Plan: We discussed her exam findings and symptoms. There is no obvious suggestion of reactive arthritis, she did have a fall with potential injury 3 days ago although she didn't have pain immediately. She will begin supportive care and we will call with results of x-ray and labs  Specific recommendations today include:  The cause of the swelling and pain is unclear, could be inflammation, could be gout, could be infection  I recommend you use ice pack 20 minutes at a time to the hand and elevate the hand  I recommend you use an over-the-counter hand/wrist splint for the next few days  Go for x-ray, we will call with x-ray and lab results  You may use over-the-counter ibuprofen, 4 tablets 3 times daily for pain and inflammation  If your white count is elevated, I may put you on antibiotic for possible infection  If worsening swelling, redness, fever, then let me know right away  Followup pending studies

## 2014-04-19 ENCOUNTER — Telehealth: Payer: Self-pay | Admitting: Medical

## 2014-04-19 ENCOUNTER — Other Ambulatory Visit: Payer: Self-pay | Admitting: Medical

## 2014-04-19 MED ORDER — CEPHALEXIN 500 MG PO CAPS: 500.0000 mg | ORAL_CAPSULE | Freq: Three times a day (TID) | ORAL | Status: DC

## 2014-04-19 NOTE — Telephone Encounter (Signed)
Verify whether she has a fever or not?    Let's have her come in Today for a shot of 1 g Rocephin antibiotic, I am going to send to her pharmacy a similar antibiotic to take by mouth.  Let's have her begin these antibiotics right away, have her come in tomorrow afternoon for followup.

## 2014-04-19 NOTE — Telephone Encounter (Signed)
Patient called stating she was supposed to come in tomorrow and get a shot and she wanted to come today. Per Vincenza HewsShane he needs to see her also and he has no openings today. She will keep appt tomorrow morning, advised her per Vincenza HewsShane that she has to start Rx he sent in today. Rx is 3 times a day and he wants her to get in at least 2 doses today, pt informed she is going to pick up Rx now

## 2014-04-19 NOTE — Telephone Encounter (Signed)
Spoke with patient, she has not taken her temperature but doesn't feel like she has a fever, however she reports night sweats last evening. She states that her pain is better, that the swelling has spread and that she is a little improved. I advised her that Vincenza HewsShane would like her to come to office for an injection of antibiotic, then to start oral antibiotics and follow up here in am. Patient declines to come in for injection stating she is in [place] and can not get here. She will go by the pharmacy and pick up rx and start taking it today. I made her an [facility]

## 2014-04-20 ENCOUNTER — Ambulatory Visit (INDEPENDENT_AMBULATORY_CARE_PROVIDER_SITE_OTHER): Payer: 59 | Admitting: Medical

## 2014-04-20 ENCOUNTER — Encounter: Payer: Self-pay | Admitting: Medical

## 2014-04-20 VITALS — BP 130/84 | HR 92 | Temp 98.4°F | Resp 14 | Ht 64.0 in | Wt 173.0 lb

## 2014-04-20 DIAGNOSIS — M25441 Effusion, right hand: Secondary | ICD-10-CM

## 2014-04-20 DIAGNOSIS — M79644 Pain in right finger(s): Secondary | ICD-10-CM

## 2014-04-20 DIAGNOSIS — M25449 Effusion, unspecified hand: Secondary | ICD-10-CM

## 2014-04-20 DIAGNOSIS — M79609 Pain in unspecified limb: Secondary | ICD-10-CM

## 2014-04-20 NOTE — Progress Notes (Signed)
Subjective: Here for recheck on right hand swelling and pain.  She is right handed.  I saw her 2 days ago for painful swollen right middle finger that didn't seem to be able to move.  By the next day started swelling.  She does note that she fell August 2, tripped over stuff that was in 1 of her rooms in her house. She didn't recall pain or an obvious injury then, but thinking back it is the only thing that has been different. She denies any other injury, trauma, puncture wound, or bite.  She denies history of similar pain and swelling of the joint. She denies history of gout or reactive arthritis.  Denies fever, no nausea, no vomiting.  Denies vision changes.  Denies urinary changes, no concern for STD.  She got 2 doses of the Keflex in yesterday, using ice, using elevation, using hand splint OTC, using Ibuprofen. No other aggravating or relieving factors.  No other complaint.  Review of Systems Constitutional: -fever, -chills, -sweats, -unexpected weight change,-fatigue ENT: -runny nose, -ear pain, -sore throat Cardiology:  -chest pain, -palpitations, -edema Respiratory: -cough, -shortness of breath, -wheezing Gastroenterology: -abdominal pain, -nausea, -vomiting, -diarrhea, -constipation  Hematology: -bleeding or bruising problems Ophthalmology: -vision changes Urology: -dysuria, -difficulty urinating, -hematuria, -urinary frequency, -urgency Neurology: -headache, -weakness, -tingling, -numbness     Objective: Gen:wd, wn, nad Skin: no erythema or ecchymosis, no warmth today MSK: Right middle finger with minimal swelling at the MCP now much improved from 2 days ago, still somewhat decreased range of motion of that finger throughout,mildly tender to touch all along the middle finger and 3rd  Metacarpal, pain in same finger with wrist ROM, but wrist, hand, arm otherwise nontender.  No other deformity or abnormality noted Hand/fingers otherwise neurovascularly intact    Assessment: Encounter  Diagnoses  Name Primary?  . Finger joint swelling, right Yes  . Finger pain, right    Plan: Surprisingly, it looks much improved today.  Her uric acid and sedimentation rate labs were normal, no leukocytosis, x-ray of the hand and finger was negative for fracture. Advise at this point she continue ibuprofen 3 times a day for the next few days ice, elevation, splint, and finish the Keflex. Return or call sooner if fever or worse. Otherwise call back within a week and a half if not completely resolved and back to normal  Gave her words of encouragement regarding her mom who is in the hospital currently not doing so good with end-stage dementia

## 2014-05-03 NOTE — Telephone Encounter (Signed)
Pt called back & I gave her lab results and notes from [facility].  She states she is much better but finger still very weak, no strength in it at all.  She will call if any other problems or if it gets worse

## 2014-09-25 ENCOUNTER — Ambulatory Visit (INDEPENDENT_AMBULATORY_CARE_PROVIDER_SITE_OTHER): Payer: 59 | Admitting: Medical

## 2014-09-25 ENCOUNTER — Encounter: Payer: Self-pay | Admitting: Medical

## 2014-09-25 VITALS — BP 98/70 | HR 84 | Temp 98.1°F | Resp 14 | Wt 170.0 lb

## 2014-09-25 DIAGNOSIS — R21 Rash and other nonspecific skin eruption: Secondary | ICD-10-CM

## 2014-09-25 DIAGNOSIS — Z9889 Other specified postprocedural states: Secondary | ICD-10-CM

## 2014-09-25 DIAGNOSIS — L659 Nonscarring hair loss, unspecified: Secondary | ICD-10-CM

## 2014-09-25 DIAGNOSIS — Z95828 Presence of other vascular implants and grafts: Secondary | ICD-10-CM

## 2014-09-25 MED ORDER — MINOXIDIL 2 % EX SOLN: Freq: Two times a day (BID) | CUTANEOUS | Status: DC
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
## 2014-09-25 NOTE — Patient Instructions (Signed)
For the facial rash, use either OTC Aquafor cream daily.   Or use benadryl oral tablet at bedtime and OTC Hydrocortisone cream topically to the rash for up to 1 week.   If not improving in 7-10 days, then let me know  Begin Women's Rogaine topical solution twice daily to the scalp.   continue the vitamins you are taking.  Lets give this a month or more to see how you respond  I will call back about the IVC filter.   Return soon for a physical and labs.

## 2014-09-26 ENCOUNTER — Other Ambulatory Visit: Payer: Self-pay | Admitting: Medical

## 2014-09-26 ENCOUNTER — Telehealth: Payer: Self-pay | Admitting: Medical

## 2014-09-26 DIAGNOSIS — Z95828 Presence of other vascular implants and grafts: Secondary | ICD-10-CM

## 2014-09-26 NOTE — Telephone Encounter (Signed)
Please call the patient. 

## 2014-09-26 NOTE — Telephone Encounter (Signed)
I spoke to a specialist in regard to her IVC filter.  At this point, we need to get a chest xray to see that the filter is still in the vena cava, and we will need to get old records from the hospitalization she had when the IVC filter was placed.   Please have her sign and we need to get the records.   If she had a retrievable IVC, then the specialist said they would have given her a card that states the type of device she had surgically implanted.   See if she has this card at home to identify the filter?   Of note, once we have records, CXR, then we can consider interventional radiology consult for having this removed possibly.  But it depends upon records.

## 2014-09-27 NOTE — Telephone Encounter (Signed)
Spoke with patient, she will come by tomorrow to sign release of records, and get information on going for a CXR. She scheduled an AEX for 10/04/14

## 2014-09-28 ENCOUNTER — Ambulatory Visit
Admission: RE | Admit: 2014-09-28 | Discharge: 2014-09-28 | Disposition: A | Discharge status: Home / Self Care | Payer: 59 | Source: Ambulatory Visit | Attending: Medical | Admitting: Medical

## 2014-09-28 DIAGNOSIS — Z95828 Presence of other vascular implants and grafts: Secondary | ICD-10-CM

## 2014-09-28 NOTE — Telephone Encounter (Signed)
shane see msg. below

## 2014-10-04 ENCOUNTER — Ambulatory Visit (INDEPENDENT_AMBULATORY_CARE_PROVIDER_SITE_OTHER): Payer: 59 | Admitting: Medical

## 2014-10-04 ENCOUNTER — Encounter: Payer: Self-pay | Admitting: Medical

## 2014-10-04 VITALS — BP 90/60 | HR 78 | Temp 98.3°F | Resp 15 | Ht 64.0 in | Wt 170.0 lb

## 2014-10-04 DIAGNOSIS — Z86718 Personal history of other venous thrombosis and embolism: Secondary | ICD-10-CM

## 2014-10-04 DIAGNOSIS — Z23 Encounter for immunization: Secondary | ICD-10-CM

## 2014-10-04 DIAGNOSIS — Z0000 Encounter for general adult medical examination without abnormal findings: Secondary | ICD-10-CM

## 2014-10-04 DIAGNOSIS — Z95828 Presence of other vascular implants and grafts: Secondary | ICD-10-CM

## 2014-10-04 DIAGNOSIS — Z9889 Other specified postprocedural states: Secondary | ICD-10-CM

## 2014-10-04 LAB — COMPREHENSIVE METABOLIC PANEL
ALT: 13 U/L (ref 0–35)
AST: 19 U/L (ref 0–37)
Albumin: 4.4 g/dL (ref 3.5–5.2)
Alkaline Phosphatase: 39 U/L (ref 39–117)
BUN: 13 mg/dL (ref 6–23)
CO2: 23 mEq/L (ref 19–32)
Calcium: 9.6 mg/dL (ref 8.4–10.5)
Chloride: 103 mEq/L (ref 96–112)
Creat: 0.84 mg/dL (ref 0.50–1.10)
Glucose, Bld: 77 mg/dL (ref 70–99)
Potassium: 4.1 mEq/L (ref 3.5–5.3)
Sodium: 140 mEq/L (ref 135–145)
Total Bilirubin: 0.5 mg/dL (ref 0.2–1.2)
Total Protein: 7.2 g/dL (ref 6.0–8.3)

## 2014-10-04 LAB — LIPID PANEL
Cholesterol: 142 mg/dL (ref 0–200)
HDL: 52 mg/dL (ref >39)
LDL Cholesterol: 83 mg/dL (ref 0–99)
Total CHOL/HDL Ratio: 2.7 Ratio
Triglycerides: 35 mg/dL (ref <150)
VLDL: 7 mg/dL (ref 0–40)

## 2014-10-04 LAB — POCT URINALYSIS DIPSTICK
BILIRUBIN UA: NEGATIVE
Blood, UA: NEGATIVE
Glucose, UA: NEGATIVE
LEUKOCYTES UA: NEGATIVE
Nitrite, UA: NEGATIVE
PH UA: 5.5
Protein, UA: NEGATIVE
Spec Grav, UA: >=1.03
Urobilinogen, UA: NEGATIVE

## 2014-10-04 LAB — CBC
HEMATOCRIT: 41.1 % (ref 36.0–46.0)
Hemoglobin: 13.9 g/dL (ref 12.0–15.0)
MCH: 29.1 pg (ref 26.0–34.0)
MCHC: 33.8 g/dL (ref 30.0–36.0)
MCV: 86.2 fL (ref 78.0–100.0)
MPV: 13 fL — AB (ref 8.6–12.4)
PLATELETS: 157 10*3/uL (ref 150–400)
RBC: 4.77 MIL/uL (ref 3.87–5.11)
RDW: 14.6 % (ref 11.5–15.5)
WBC: 7.9 10*3/uL (ref 4.0–10.5)
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
## 2014-10-05 LAB — VITAMIN D 25 HYDROXY (VIT D DEFICIENCY, FRACTURES): Vit D, 25-Hydroxy: 42 ng/mL (ref 30–100)

## 2014-10-08 ENCOUNTER — Telehealth: Payer: Self-pay | Admitting: Medical

## 2014-10-08 NOTE — Telephone Encounter (Signed)
Pt called and stated that she was cutting her toenails and her toenail fell off. She states it is completely gone. She stated no injury. She wants to know if this is something to be concerned about. Please call pt at [phone].

## 2014-10-09 NOTE — Telephone Encounter (Signed)
Pt informed word for word and verbalized understanding 

## 2014-10-09 NOTE — Telephone Encounter (Signed)
I'd have to see it.  If she has tight shoes, sometimes runners or people exercising regularly will have a toenail come off depending upon the way the foot rubs in the shoe.   As long as no redness, pus, swelling to suggest infection, no major need to worry.

## 2014-10-10 LAB — HYPERCOAGULABLE PANEL, COMPREHENSIVE
ANTICARDIOLIPIN IGA: 13 U/mL (ref <22)
ANTICARDIOLIPIN IGM: 1 [MPL'U]/mL (ref <11)
AntiThromb III Func: 101 % (ref 76–126)
Anticardiolipin IgG: 9 GPL U/mL (ref <23)
BETA 2 GLYCO I IGG: 9 G Units (ref <20)
BETA-2-GLYCOPROTEIN I IGM: 6 M Units (ref <20)
Beta-2-Glycoprotein I IgA: 4 A Units (ref <20)
DRVVT: 35.6 s (ref <42.9)
Lupus Anticoagulant: NOT DETECTED
PROTEIN C, TOTAL: 68 % — AB (ref 72–160)
PROTEIN S TOTAL: 91 % (ref 60–150)
PTT Lupus Anticoagulant: 38.1 secs (ref 28.0–43.0)
Protein C Activity: 92 % (ref 75–133)
Protein S Activity: 90 % (ref 69–129)

## 2014-10-12 ENCOUNTER — Telehealth: Payer: Self-pay | Admitting: Internal Medicine

## 2014-10-12 NOTE — Telephone Encounter (Signed)
Received records from [facility]

## 2014-10-18 ENCOUNTER — Telehealth: Payer: Self-pay | Admitting: Medical

## 2014-10-18 NOTE — Telephone Encounter (Signed)
I don't think there was anything significant regarding risk of clots.  I have received records, and give me a chance to go through them this week and we'll call back

## 2014-10-18 NOTE — Telephone Encounter (Signed)
Pt called wanting to know if 2nd labs are in to show if clotting is occuring

## 2014-10-18 NOTE — Telephone Encounter (Signed)
Vincenza HewsShane, advised patient we will get back to her.

## 2014-10-24 ENCOUNTER — Telehealth: Payer: Self-pay | Admitting: Medical

## 2014-10-24 NOTE — Telephone Encounter (Signed)
Given that it took 2 months to get records last time can we please call and as a follow-up to the request to say I only need the operative report

## 2014-10-24 NOTE — Telephone Encounter (Signed)
I see where [facility] back in January for all records. I have added a note to the original form to get the operative note for IVC filter, so hopefully they will send it to me. However if they do not have anything and need to get it from somewhere else, Pt will have to sign so we can get the records.

## 2014-10-24 NOTE — Telephone Encounter (Signed)
pls send request [facility] (see other records that came in).   I specifically need the operative report from where her IVC filter was placed for blood clots.  I don't need aanythingelse, just this.

## 2014-10-25 ENCOUNTER — Telehealth: Payer: Self-pay | Admitting: Medical

## 2014-10-25 NOTE — Telephone Encounter (Signed)
Sent fax the other day to get operative report but have not seen anything come through. I called and left a message with [facility] about getting operative report. Hope to hear back or see something come through the fax.

## 2014-10-25 NOTE — Telephone Encounter (Signed)
The labs were ok regarding clots.   I have reviewed a lot of her hospital records.   I am still trying to get the operative report from the IVC filter.     I would like to have her see neuropsychiatry as a follow up based on records I reviewed.   See if we can make referral to neuro psych.  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

## 2014-10-25 NOTE — Telephone Encounter (Signed)
Pt requesting an update on her lab results

## 2014-10-26 NOTE — Telephone Encounter (Signed)
LMOM TO CB. CLS 

## 2014-10-29 NOTE — Telephone Encounter (Signed)
LMOM TO CB. CLS 

## 2014-11-02 ENCOUNTER — Telehealth: Payer: Self-pay | Admitting: Medical

## 2014-11-02 NOTE — Telephone Encounter (Signed)
Patient had questions about taking medication for blood thinning by mouth? I explain to her that you hadn't received all of records yet.  Patient is aware of Kristian CoveyShane Tysinger PA message in full detail.

## 2014-11-02 NOTE — Telephone Encounter (Signed)
Patient is aware of Shane Tysinger PA message in detail 

## 2014-11-02 NOTE — Telephone Encounter (Signed)
At this point I am only waiting on the operation note show me what type of filter she had placed and she was hospitalized.  Pending this we will refer to interventional radiology

## 2014-11-02 NOTE — Telephone Encounter (Signed)
Refer to interventional radiologist Dr. Corliss Skainseveshwar, regarding potential removal of IVC filter.   Send the newly received OP notes regarding IVC filter placement.  See me about this.

## 2014-11-05 ENCOUNTER — Encounter: Payer: Self-pay | Admitting: Medical

## 2014-11-05 ENCOUNTER — Other Ambulatory Visit: Payer: Self-pay | Admitting: Medical

## 2014-11-05 NOTE — Telephone Encounter (Signed)
I fax over [facility] and Letter with orders from Kristian CoveyShane Tysinger PA to [place] [facility] Fax # [phone] and Victorino DikeJennifer states that she would handle this from there.

## 2014-11-05 NOTE — Telephone Encounter (Signed)
Don't forget to type the letter of orders to send to IR over at the hospital.

## 2014-11-05 NOTE — Telephone Encounter (Signed)
See letter.

## 2014-11-06 ENCOUNTER — Other Ambulatory Visit: Payer: Self-pay | Admitting: Medical

## 2014-11-06 DIAGNOSIS — Z95828 Presence of other vascular implants and grafts: Secondary | ICD-10-CM

## 2014-11-15 ENCOUNTER — Ambulatory Visit
Admission: RE | Admit: 2014-11-15 | Discharge: 2014-11-15 | Disposition: A | Discharge status: Home / Self Care | Payer: 59 | Source: Ambulatory Visit | Attending: Medical | Admitting: Medical

## 2014-11-15 DIAGNOSIS — Z95828 Presence of other vascular implants and grafts: Secondary | ICD-10-CM
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
## 2014-11-16 ENCOUNTER — Telehealth: Payer: Self-pay | Admitting: Family Medicine

## 2014-11-16 NOTE — Telephone Encounter (Signed)
Patient called about her appointment at the [facility]. I left the patient a voicemail with all of her appointment details.  [facility] [address], [address] [place], [place] [address] [phone]   APPOINTMENT 01/09/15 @ 330 PM

## 2014-11-19 ENCOUNTER — Other Ambulatory Visit: Payer: Self-pay | Admitting: Obstetrics and Gynecology

## 2014-11-19 ENCOUNTER — Other Ambulatory Visit (HOSPITAL_COMMUNITY)
Admission: RE | Admit: 2014-11-19 | Discharge: 2014-11-19 | Disposition: A | Discharge status: Home / Self Care | Payer: 59 | Source: Ambulatory Visit | Attending: Obstetrics and Gynecology | Admitting: Obstetrics and Gynecology

## 2014-11-19 DIAGNOSIS — Z01419 Encounter for gynecological examination (general) (routine) without abnormal findings: Secondary | ICD-10-CM | POA: Insufficient documentation

## 2014-11-21 LAB — CYTOLOGY - PAP

## 2015-01-16 IMAGING — CR DG HAND COMPLETE 3+V*R*
3 series · 3 of 3 positions shown · non-contrast
Comparison: None.

CLINICAL DATA: Fell.  Right hand pain.

EXAM:
RIGHT HAND - COMPLETE 3+ VIEW

[view not recorded (1 of 3)]
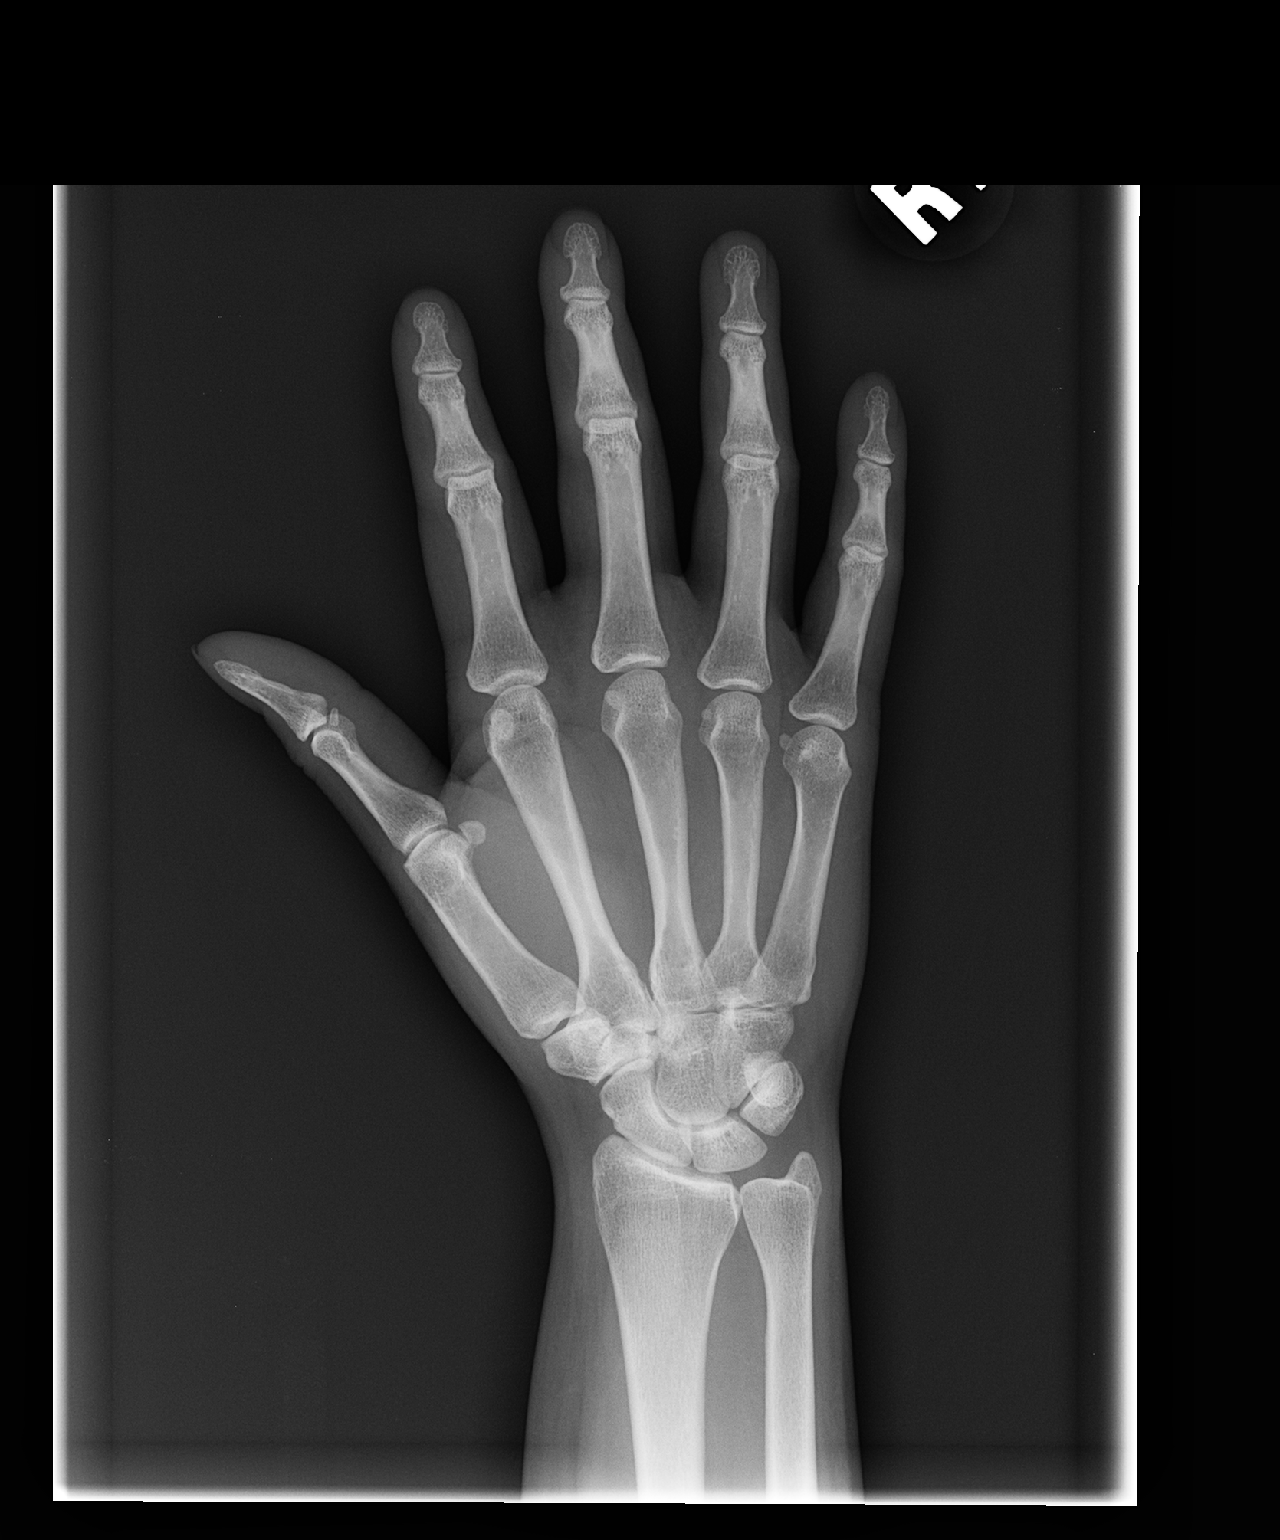

[view not recorded (2 of 3)]
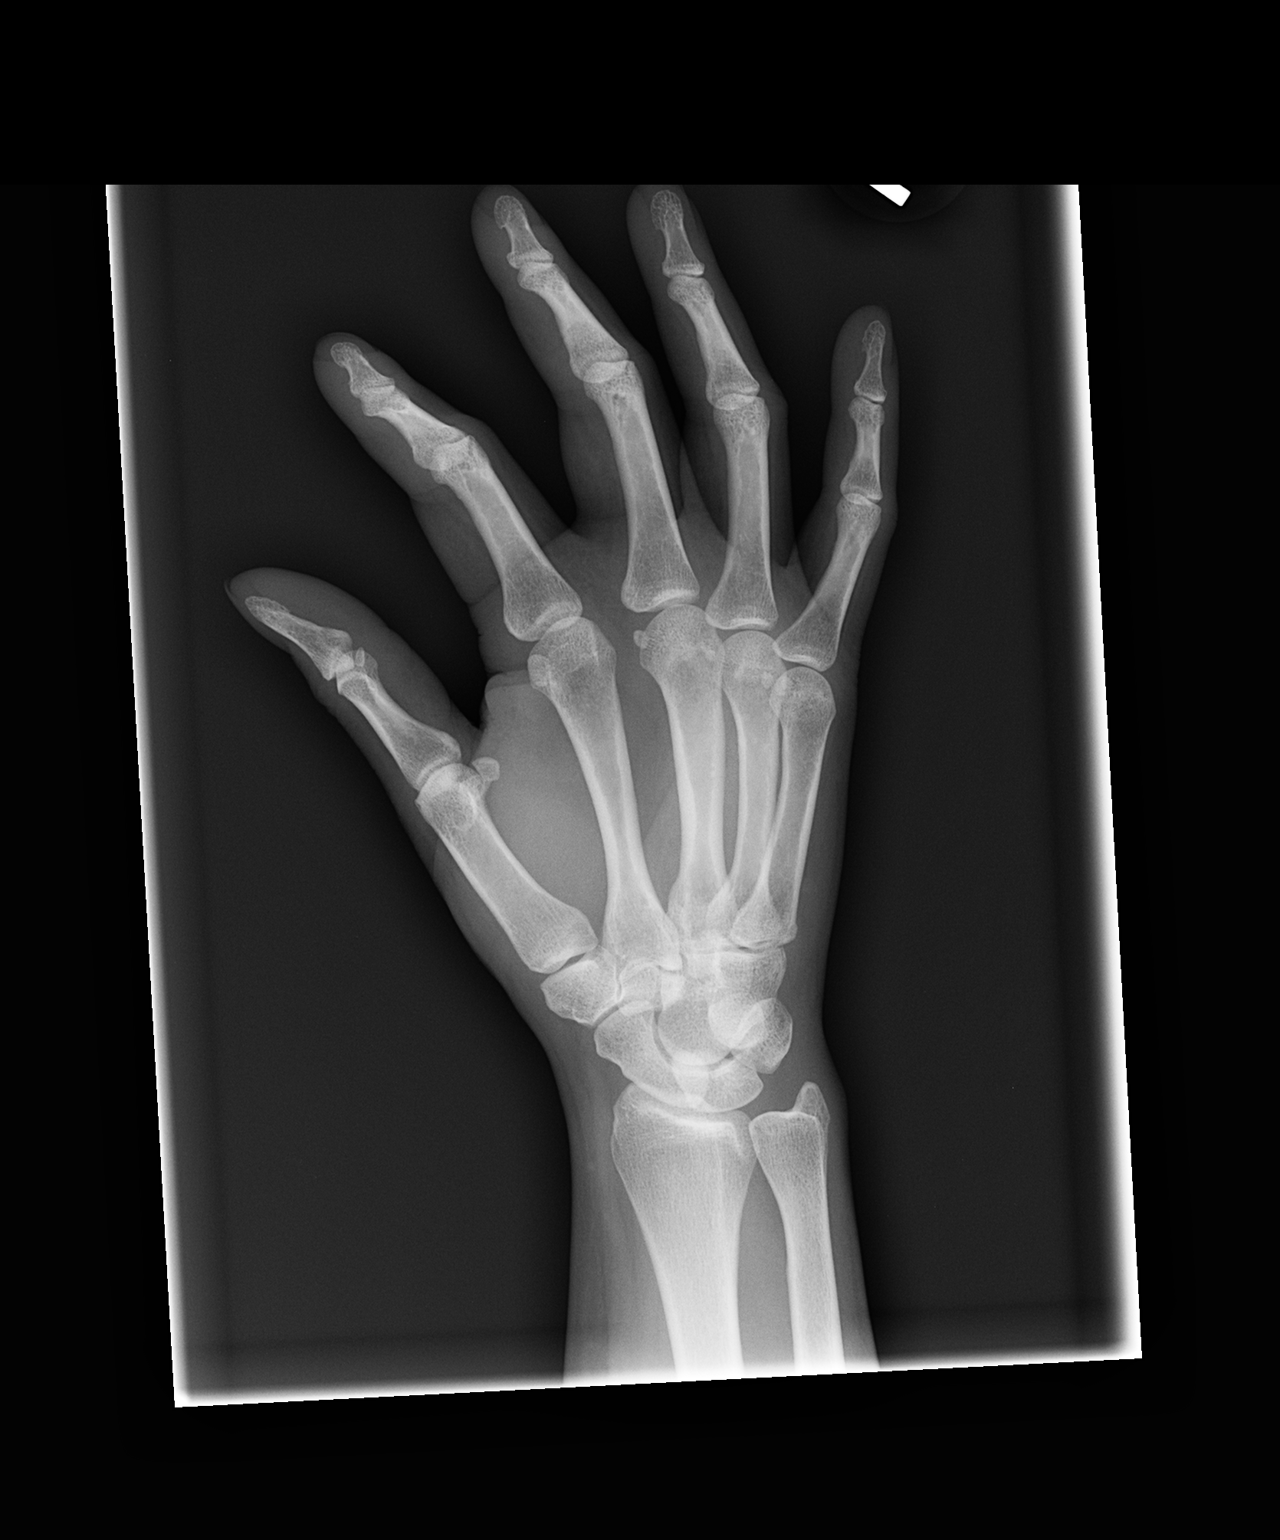

[view not recorded (3 of 3)]
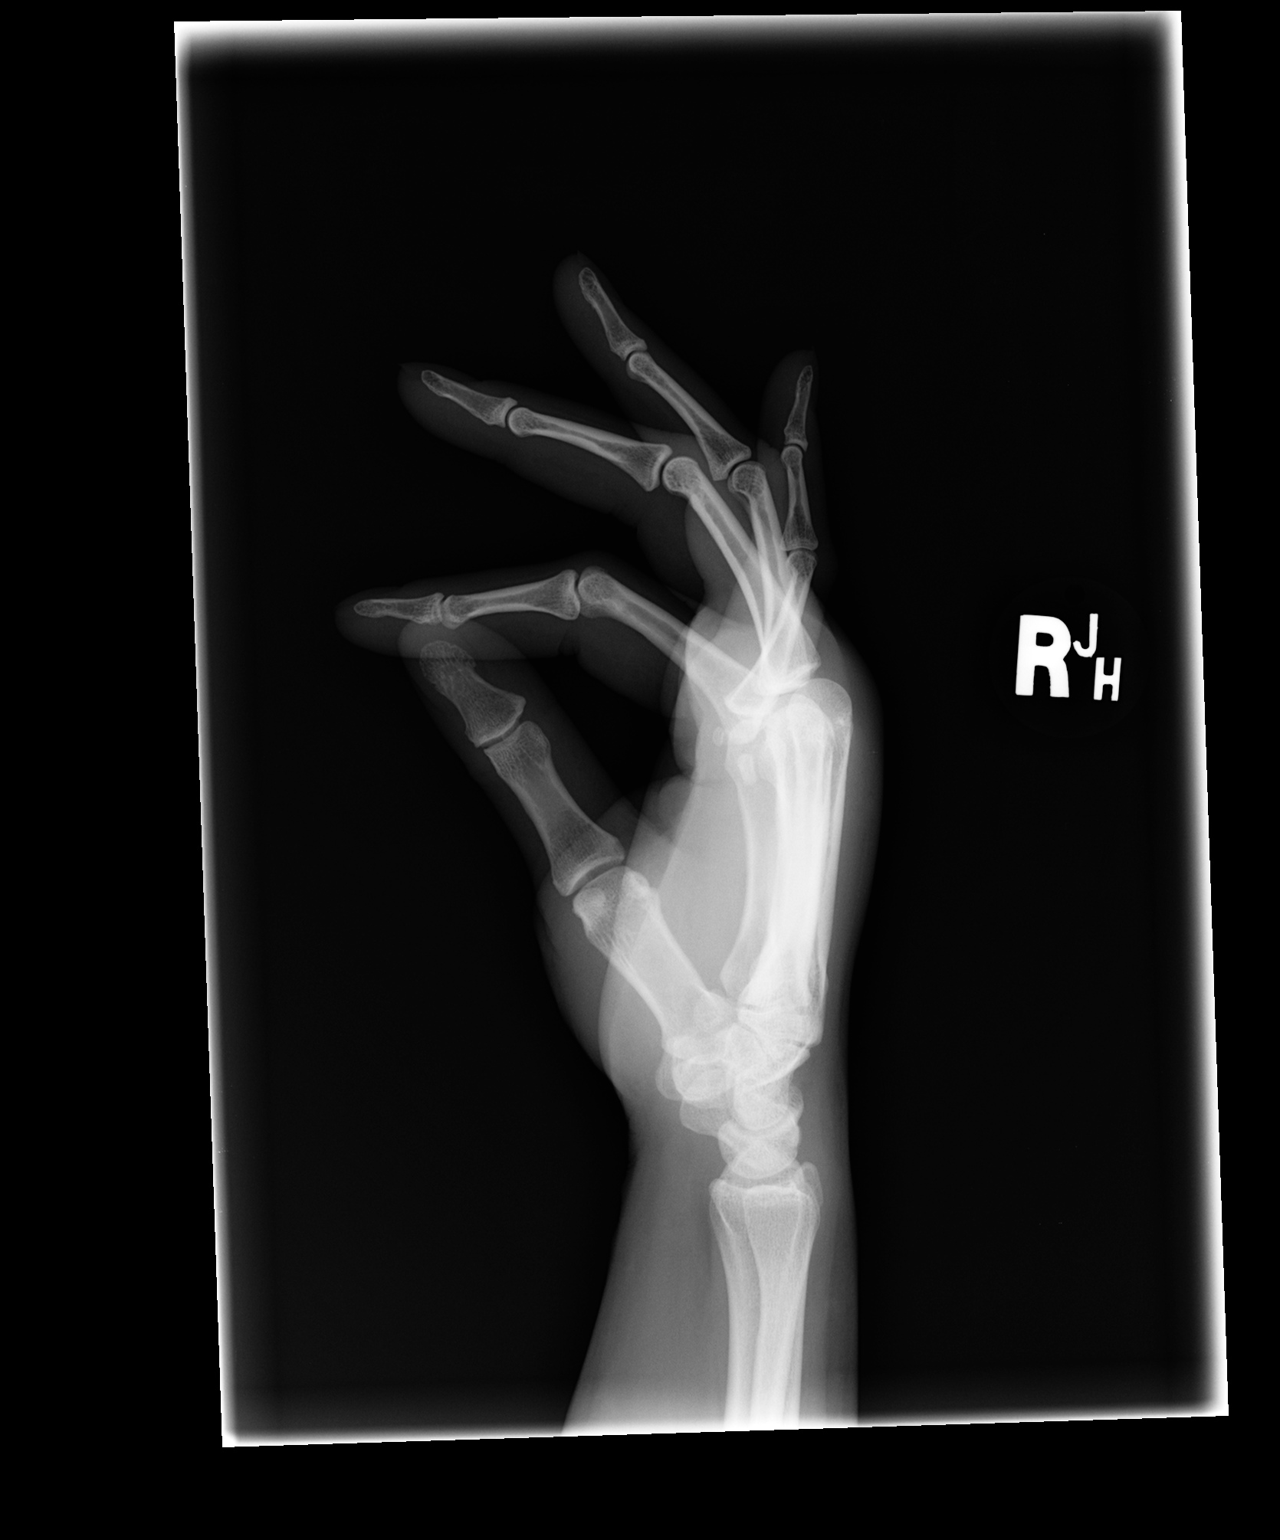

[3 of 3 positions shown; findings below may reference images not displayed]

FINDINGS: The joint spaces are maintained. No acute fracture. No degenerative
changes. Mild dorsal soft tissue swelling is noted.
IMPRESSION: No acute bony findings.

## 2015-04-17 ENCOUNTER — Telehealth: Payer: Self-pay | Admitting: Medical

## 2015-04-17 NOTE — Telephone Encounter (Signed)
Pt called and wants to know if she is pre diabetic.

## 2015-04-17 NOTE — Telephone Encounter (Signed)
No, not prediabetic as of last visit.  Have her come in for 71mo f/u on chronic issues, headaches, asthma.

## 2015-04-17 NOTE — Telephone Encounter (Signed)
Patient is aware of the message from [facility] PA and she states that she currently doesn't have insurance so she will call back to make an appointment

## 2015-04-17 NOTE — Telephone Encounter (Signed)
Pt called and wants to know is she is pre diabetic

## 2015-05-30 ENCOUNTER — Telehealth: Payer: Self-pay

## 2015-05-31 NOTE — Telephone Encounter (Signed)
error 

## 2015-09-24 ENCOUNTER — Telehealth: Payer: Self-pay

## 2015-09-24 DIAGNOSIS — R42 Dizziness and giddiness: Secondary | ICD-10-CM | POA: Diagnosis not present

## 2015-09-24 NOTE — Telephone Encounter (Signed)
Pt called earlier spoke with amber and asked about a referral we did for her 2-3 years ago. The only older she seen was Ortho. Pt then called about a cardiology referral, no records of this. Then the pt called again and was sent to my voicemail about the referral for her filter, I called her back and gave her dr Cato Mulligandevashwars information.

## 2015-10-03 DIAGNOSIS — R42 Dizziness and giddiness: Secondary | ICD-10-CM | POA: Diagnosis not present

## 2015-10-03 DIAGNOSIS — H9313 Tinnitus, bilateral: Secondary | ICD-10-CM | POA: Diagnosis not present

## 2015-11-07 DIAGNOSIS — S069X9S Unspecified intracranial injury with loss of consciousness of unspecified duration, sequela: Secondary | ICD-10-CM | POA: Diagnosis not present

## 2015-11-07 DIAGNOSIS — I82402 Acute embolism and thrombosis of unspecified deep veins of left lower extremity: Secondary | ICD-10-CM | POA: Diagnosis not present

## 2015-11-07 DIAGNOSIS — Z0000 Encounter for general adult medical examination without abnormal findings: Secondary | ICD-10-CM | POA: Diagnosis not present

## 2015-11-07 DIAGNOSIS — Z6831 Body mass index (BMI) 31.0-31.9, adult: Secondary | ICD-10-CM | POA: Diagnosis not present

## 2015-11-07 DIAGNOSIS — J45909 Unspecified asthma, uncomplicated: Secondary | ICD-10-CM | POA: Diagnosis not present

## 2015-11-07 DIAGNOSIS — R7309 Other abnormal glucose: Secondary | ICD-10-CM | POA: Diagnosis not present

## 2015-11-13 DIAGNOSIS — R7309 Other abnormal glucose: Secondary | ICD-10-CM | POA: Diagnosis not present

## 2015-12-11 ENCOUNTER — Other Ambulatory Visit: Payer: Self-pay | Admitting: Obstetrics and Gynecology

## 2015-12-11 ENCOUNTER — Other Ambulatory Visit (HOSPITAL_COMMUNITY)
Admission: RE | Admit: 2015-12-11 | Discharge: 2015-12-11 | Disposition: A | Discharge status: Home / Self Care | Payer: PPO | Source: Ambulatory Visit | Attending: Obstetrics and Gynecology | Admitting: Obstetrics and Gynecology

## 2015-12-11 DIAGNOSIS — Z01419 Encounter for gynecological examination (general) (routine) without abnormal findings: Secondary | ICD-10-CM | POA: Diagnosis not present

## 2015-12-11 DIAGNOSIS — Z8619 Personal history of other infectious and parasitic diseases: Secondary | ICD-10-CM | POA: Diagnosis not present

## 2015-12-11 DIAGNOSIS — Z1151 Encounter for screening for human papillomavirus (HPV): Secondary | ICD-10-CM | POA: Insufficient documentation

## 2015-12-12 LAB — CYTOLOGY - PAP

## 2015-12-19 ENCOUNTER — Ambulatory Visit: Payer: PPO | Attending: Otolaryngology | Admitting: Audiology

## 2015-12-19 DIAGNOSIS — H9193 Unspecified hearing loss, bilateral: Secondary | ICD-10-CM

## 2015-12-19 DIAGNOSIS — H748X9 Other specified disorders of middle ear and mastoid, unspecified ear: Secondary | ICD-10-CM

## 2015-12-19 DIAGNOSIS — H5509 Other forms of nystagmus: Secondary | ICD-10-CM | POA: Diagnosis not present

## 2015-12-19 DIAGNOSIS — R292 Abnormal reflex: Secondary | ICD-10-CM | POA: Insufficient documentation
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
## 2016-01-15 DIAGNOSIS — R42 Dizziness and giddiness: Secondary | ICD-10-CM | POA: Diagnosis not present

## 2016-10-06 DIAGNOSIS — M545 Low back pain: Secondary | ICD-10-CM | POA: Diagnosis not present

## 2016-10-06 DIAGNOSIS — M25562 Pain in left knee: Secondary | ICD-10-CM | POA: Diagnosis not present

## 2016-10-06 DIAGNOSIS — M79661 Pain in right lower leg: Secondary | ICD-10-CM | POA: Diagnosis not present

## 2016-10-06 DIAGNOSIS — M9903 Segmental and somatic dysfunction of lumbar region: Secondary | ICD-10-CM | POA: Diagnosis not present

## 2016-10-06 DIAGNOSIS — M25561 Pain in right knee: Secondary | ICD-10-CM | POA: Diagnosis not present

## 2016-10-06 DIAGNOSIS — M62830 Muscle spasm of back: Secondary | ICD-10-CM | POA: Diagnosis not present

## 2016-10-06 DIAGNOSIS — M79662 Pain in left lower leg: Secondary | ICD-10-CM | POA: Diagnosis not present

## 2016-10-14 DIAGNOSIS — M25562 Pain in left knee: Secondary | ICD-10-CM | POA: Diagnosis not present

## 2016-10-14 DIAGNOSIS — M79661 Pain in right lower leg: Secondary | ICD-10-CM | POA: Diagnosis not present

## 2016-10-14 DIAGNOSIS — M79662 Pain in left lower leg: Secondary | ICD-10-CM | POA: Diagnosis not present

## 2016-10-14 DIAGNOSIS — M545 Low back pain: Secondary | ICD-10-CM | POA: Diagnosis not present

## 2016-10-14 DIAGNOSIS — M62830 Muscle spasm of back: Secondary | ICD-10-CM | POA: Diagnosis not present

## 2016-10-14 DIAGNOSIS — M9903 Segmental and somatic dysfunction of lumbar region: Secondary | ICD-10-CM | POA: Diagnosis not present

## 2016-10-14 DIAGNOSIS — M25561 Pain in right knee: Secondary | ICD-10-CM | POA: Diagnosis not present

## 2016-10-26 DIAGNOSIS — M9903 Segmental and somatic dysfunction of lumbar region: Secondary | ICD-10-CM | POA: Diagnosis not present

## 2016-10-26 DIAGNOSIS — M545 Low back pain: Secondary | ICD-10-CM | POA: Diagnosis not present

## 2016-10-26 DIAGNOSIS — M79661 Pain in right lower leg: Secondary | ICD-10-CM | POA: Diagnosis not present

## 2016-10-26 DIAGNOSIS — M79662 Pain in left lower leg: Secondary | ICD-10-CM | POA: Diagnosis not present

## 2016-10-26 DIAGNOSIS — M62830 Muscle spasm of back: Secondary | ICD-10-CM | POA: Diagnosis not present

## 2016-10-26 DIAGNOSIS — M25561 Pain in right knee: Secondary | ICD-10-CM | POA: Diagnosis not present

## 2016-10-26 DIAGNOSIS — M25562 Pain in left knee: Secondary | ICD-10-CM | POA: Diagnosis not present

## 2016-10-28 DIAGNOSIS — M545 Low back pain: Secondary | ICD-10-CM | POA: Diagnosis not present

## 2016-10-28 DIAGNOSIS — M62830 Muscle spasm of back: Secondary | ICD-10-CM | POA: Diagnosis not present

## 2016-10-28 DIAGNOSIS — M25561 Pain in right knee: Secondary | ICD-10-CM | POA: Diagnosis not present

## 2016-10-28 DIAGNOSIS — M79661 Pain in right lower leg: Secondary | ICD-10-CM | POA: Diagnosis not present

## 2016-10-28 DIAGNOSIS — M79662 Pain in left lower leg: Secondary | ICD-10-CM | POA: Diagnosis not present

## 2016-10-28 DIAGNOSIS — M25562 Pain in left knee: Secondary | ICD-10-CM | POA: Diagnosis not present

## 2016-10-28 DIAGNOSIS — M9903 Segmental and somatic dysfunction of lumbar region: Secondary | ICD-10-CM | POA: Diagnosis not present

## 2016-11-02 DIAGNOSIS — M62830 Muscle spasm of back: Secondary | ICD-10-CM | POA: Diagnosis not present

## 2016-11-02 DIAGNOSIS — M25561 Pain in right knee: Secondary | ICD-10-CM | POA: Diagnosis not present

## 2016-11-02 DIAGNOSIS — M9903 Segmental and somatic dysfunction of lumbar region: Secondary | ICD-10-CM | POA: Diagnosis not present

## 2016-11-02 DIAGNOSIS — M25562 Pain in left knee: Secondary | ICD-10-CM | POA: Diagnosis not present

## 2016-11-02 DIAGNOSIS — M545 Low back pain: Secondary | ICD-10-CM | POA: Diagnosis not present

## 2016-11-02 DIAGNOSIS — M79662 Pain in left lower leg: Secondary | ICD-10-CM | POA: Diagnosis not present

## 2016-11-02 DIAGNOSIS — M79661 Pain in right lower leg: Secondary | ICD-10-CM | POA: Diagnosis not present

## 2016-11-11 DIAGNOSIS — M62830 Muscle spasm of back: Secondary | ICD-10-CM | POA: Diagnosis not present

## 2016-11-11 DIAGNOSIS — M79662 Pain in left lower leg: Secondary | ICD-10-CM | POA: Diagnosis not present

## 2016-11-11 DIAGNOSIS — M545 Low back pain: Secondary | ICD-10-CM | POA: Diagnosis not present

## 2016-11-11 DIAGNOSIS — M9903 Segmental and somatic dysfunction of lumbar region: Secondary | ICD-10-CM | POA: Diagnosis not present

## 2016-11-11 DIAGNOSIS — M79661 Pain in right lower leg: Secondary | ICD-10-CM | POA: Diagnosis not present

## 2016-11-11 DIAGNOSIS — M25561 Pain in right knee: Secondary | ICD-10-CM | POA: Diagnosis not present

## 2016-11-11 DIAGNOSIS — M25562 Pain in left knee: Secondary | ICD-10-CM | POA: Diagnosis not present

## 2016-11-25 DIAGNOSIS — M545 Low back pain: Secondary | ICD-10-CM | POA: Diagnosis not present

## 2016-11-25 DIAGNOSIS — M25561 Pain in right knee: Secondary | ICD-10-CM | POA: Diagnosis not present

## 2016-11-25 DIAGNOSIS — M79661 Pain in right lower leg: Secondary | ICD-10-CM | POA: Diagnosis not present

## 2016-11-25 DIAGNOSIS — M9903 Segmental and somatic dysfunction of lumbar region: Secondary | ICD-10-CM | POA: Diagnosis not present

## 2016-11-25 DIAGNOSIS — M62830 Muscle spasm of back: Secondary | ICD-10-CM | POA: Diagnosis not present

## 2016-12-15 DIAGNOSIS — R2689 Other abnormalities of gait and mobility: Secondary | ICD-10-CM | POA: Diagnosis not present

## 2016-12-15 DIAGNOSIS — M25561 Pain in right knee: Secondary | ICD-10-CM | POA: Diagnosis not present

## 2017-02-05 DIAGNOSIS — J069 Acute upper respiratory infection, unspecified: Secondary | ICD-10-CM | POA: Diagnosis not present

## 2017-02-05 DIAGNOSIS — J45909 Unspecified asthma, uncomplicated: Secondary | ICD-10-CM | POA: Diagnosis not present

## 2017-02-05 DIAGNOSIS — L309 Dermatitis, unspecified: Secondary | ICD-10-CM | POA: Diagnosis not present

## 2017-03-30 DIAGNOSIS — N762 Acute vulvitis: Secondary | ICD-10-CM | POA: Diagnosis not present

## 2017-03-30 DIAGNOSIS — Z9189 Other specified personal risk factors, not elsewhere classified: Secondary | ICD-10-CM | POA: Diagnosis not present

## 2017-03-30 DIAGNOSIS — Z8619 Personal history of other infectious and parasitic diseases: Secondary | ICD-10-CM | POA: Diagnosis not present

## 2017-03-31 DIAGNOSIS — Z136 Encounter for screening for cardiovascular disorders: Secondary | ICD-10-CM | POA: Diagnosis not present

## 2017-03-31 DIAGNOSIS — Z0000 Encounter for general adult medical examination without abnormal findings: Secondary | ICD-10-CM | POA: Diagnosis not present

## 2017-03-31 DIAGNOSIS — H1131 Conjunctival hemorrhage, right eye: Secondary | ICD-10-CM | POA: Diagnosis not present

## 2018-07-29 DIAGNOSIS — Z136 Encounter for screening for cardiovascular disorders: Secondary | ICD-10-CM | POA: Diagnosis not present

## 2018-07-29 DIAGNOSIS — S0990XS Unspecified injury of head, sequela: Secondary | ICD-10-CM | POA: Diagnosis not present

## 2018-07-29 DIAGNOSIS — Z23 Encounter for immunization: Secondary | ICD-10-CM | POA: Diagnosis not present

## 2018-07-29 DIAGNOSIS — M722 Plantar fascial fibromatosis: Secondary | ICD-10-CM | POA: Diagnosis not present

## 2018-07-29 DIAGNOSIS — Z0000 Encounter for general adult medical examination without abnormal findings: Secondary | ICD-10-CM | POA: Diagnosis not present

## 2018-08-25 ENCOUNTER — Other Ambulatory Visit: Payer: Self-pay | Admitting: Obstetrics and Gynecology

## 2018-08-25 DIAGNOSIS — Z1231 Encounter for screening mammogram for malignant neoplasm of breast: Secondary | ICD-10-CM

## 2018-08-25 DIAGNOSIS — Z01419 Encounter for gynecological examination (general) (routine) without abnormal findings: Secondary | ICD-10-CM | POA: Diagnosis not present

## 2018-10-05 ENCOUNTER — Ambulatory Visit
Admission: RE | Admit: 2018-10-05 | Discharge: 2018-10-05 | Disposition: A | Discharge status: Home / Self Care | Payer: PPO | Source: Ambulatory Visit | Attending: Obstetrics and Gynecology | Admitting: Obstetrics and Gynecology

## 2018-10-05 ENCOUNTER — Other Ambulatory Visit: Payer: Self-pay | Admitting: Obstetrics and Gynecology

## 2018-10-05 DIAGNOSIS — Z1231 Encounter for screening mammogram for malignant neoplasm of breast: Secondary | ICD-10-CM

## 2018-10-11 DIAGNOSIS — H40033 Anatomical narrow angle, bilateral: Secondary | ICD-10-CM | POA: Diagnosis not present

## 2018-10-11 DIAGNOSIS — H1013 Acute atopic conjunctivitis, bilateral: Secondary | ICD-10-CM | POA: Diagnosis not present

## 2018-10-14 ENCOUNTER — Telehealth: Payer: Self-pay | Admitting: Neurology

## 2018-10-14 ENCOUNTER — Ambulatory Visit: Payer: PPO | Admitting: Neurology

## 2018-10-14 NOTE — Telephone Encounter (Signed)
This patient did not show for a new patient appointment today. 

## 2018-10-18 ENCOUNTER — Encounter: Payer: Self-pay | Admitting: Neurology

## 2018-11-06 DIAGNOSIS — J019 Acute sinusitis, unspecified: Secondary | ICD-10-CM | POA: Diagnosis not present

## 2018-11-06 DIAGNOSIS — R05 Cough: Secondary | ICD-10-CM | POA: Diagnosis not present

## 2018-11-09 ENCOUNTER — Encounter: Payer: Self-pay | Admitting: Neurology

## 2018-11-09 ENCOUNTER — Telehealth: Payer: Self-pay | Admitting: Neurology

## 2018-11-09 ENCOUNTER — Ambulatory Visit: Payer: PPO | Admitting: Neurology

## 2018-11-09 VITALS — BP 124/62 | HR 72 | Ht 64.0 in | Wt 162.3 lb

## 2018-11-09 DIAGNOSIS — Z87820 Personal history of traumatic brain injury: Secondary | ICD-10-CM | POA: Insufficient documentation

## 2018-11-09 DIAGNOSIS — R413 Other amnesia: Secondary | ICD-10-CM | POA: Diagnosis not present

## 2018-11-09 DIAGNOSIS — E538 Deficiency of other specified B group vitamins: Secondary | ICD-10-CM

## 2018-11-09 HISTORY — DX: Personal history of traumatic brain injury: Z87.820

## 2018-11-09 HISTORY — DX: Other amnesia: R41.3

## 2018-11-09 NOTE — Telephone Encounter (Signed)
health team order sent to GI. No auth they will reach out to the pt to schedule.  °
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
## 2018-11-10 ENCOUNTER — Telehealth: Payer: Self-pay

## 2018-11-10 LAB — VITAMIN B12: Vitamin B-12: 1587 pg/mL — ABNORMAL HIGH (ref 232–1245)

## 2018-11-10 LAB — HIV ANTIBODY (ROUTINE TESTING W REFLEX): HIV SCREEN 4TH GENERATION: NONREACTIVE

## 2018-11-10 LAB — RPR: RPR: NONREACTIVE

## 2018-11-10 NOTE — Telephone Encounter (Signed)
-----   Message from York Spaniel, MD sent at 11/10/2018  7:46 AM EST -----  The blood work results are unremarkable. Please call the patient. ----- Message ----- From: Nell Range Lab Results In Sent: 11/10/2018   7:38 AM EST To: York Spaniel, MD

## 2018-11-10 NOTE — Telephone Encounter (Signed)
I contacted the patient and was able to advise of lab results. Pt verbalized understanding and had no further questions at this time.

## 2018-11-20 ENCOUNTER — Ambulatory Visit
Admission: RE | Admit: 2018-11-20 | Discharge: 2018-11-20 | Disposition: A | Discharge status: Home / Self Care | Payer: PPO | Source: Ambulatory Visit | Attending: Neurology | Admitting: Neurology

## 2018-11-20 DIAGNOSIS — R413 Other amnesia: Secondary | ICD-10-CM | POA: Diagnosis not present

## 2018-11-21 ENCOUNTER — Telehealth: Payer: Self-pay | Admitting: Neurology
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
## 2018-11-25 ENCOUNTER — Other Ambulatory Visit: Payer: PPO

## 2019-05-10 ENCOUNTER — Ambulatory Visit: Payer: PPO | Admitting: Neurology

## 2019-07-25 ENCOUNTER — Other Ambulatory Visit: Payer: Self-pay | Admitting: Obstetrics and Gynecology

## 2019-07-25 DIAGNOSIS — Z1231 Encounter for screening mammogram for malignant neoplasm of breast: Secondary | ICD-10-CM

## 2019-08-28 DIAGNOSIS — Z01419 Encounter for gynecological examination (general) (routine) without abnormal findings: Secondary | ICD-10-CM | POA: Diagnosis not present

## 2019-08-31 DIAGNOSIS — Z136 Encounter for screening for cardiovascular disorders: Secondary | ICD-10-CM | POA: Diagnosis not present

## 2019-08-31 DIAGNOSIS — Z0000 Encounter for general adult medical examination without abnormal findings: Secondary | ICD-10-CM | POA: Diagnosis not present

## 2019-10-09 ENCOUNTER — Ambulatory Visit
Admission: RE | Admit: 2019-10-09 | Discharge: 2019-10-09 | Disposition: A | Discharge status: Home / Self Care | Payer: PPO | Source: Ambulatory Visit | Attending: Obstetrics and Gynecology | Admitting: Obstetrics and Gynecology

## 2019-10-09 ENCOUNTER — Other Ambulatory Visit: Payer: Self-pay

## 2019-10-09 DIAGNOSIS — Z1231 Encounter for screening mammogram for malignant neoplasm of breast: Secondary | ICD-10-CM

## 2019-12-01 ENCOUNTER — Ambulatory Visit: Payer: PPO | Attending: Internal Medicine

## 2019-12-01 DIAGNOSIS — Z20822 Contact with and (suspected) exposure to covid-19: Secondary | ICD-10-CM | POA: Diagnosis not present

## 2019-12-02 LAB — NOVEL CORONAVIRUS, NAA: SARS-CoV-2, NAA: NOT DETECTED

## 2020-06-06 DIAGNOSIS — L710 Perioral dermatitis: Secondary | ICD-10-CM | POA: Diagnosis not present

## 2020-06-06 DIAGNOSIS — L810 Postinflammatory hyperpigmentation: Secondary | ICD-10-CM | POA: Diagnosis not present

## 2020-06-13 DIAGNOSIS — H25812 Combined forms of age-related cataract, left eye: Secondary | ICD-10-CM | POA: Diagnosis not present

## 2020-07-03 DIAGNOSIS — S069X9D Unspecified intracranial injury with loss of consciousness of unspecified duration, subsequent encounter: Secondary | ICD-10-CM | POA: Diagnosis not present

## 2020-07-11 DIAGNOSIS — S20211A Contusion of right front wall of thorax, initial encounter: Secondary | ICD-10-CM | POA: Diagnosis not present

## 2020-07-11 DIAGNOSIS — R0781 Pleurodynia: Secondary | ICD-10-CM | POA: Diagnosis not present

## 2020-08-01 DIAGNOSIS — L710 Perioral dermatitis: Secondary | ICD-10-CM | POA: Diagnosis not present

## 2020-08-09 DIAGNOSIS — S60940A Unspecified superficial injury of right index finger, initial encounter: Secondary | ICD-10-CM | POA: Diagnosis not present

## 2020-08-09 DIAGNOSIS — Z20828 Contact with and (suspected) exposure to other viral communicable diseases: Secondary | ICD-10-CM | POA: Diagnosis not present

## 2021-01-28 ENCOUNTER — Other Ambulatory Visit: Payer: Self-pay | Admitting: Obstetrics and Gynecology

## 2021-01-28 DIAGNOSIS — Z1231 Encounter for screening mammogram for malignant neoplasm of breast: Secondary | ICD-10-CM

## 2021-01-29 ENCOUNTER — Ambulatory Visit
Admission: RE | Admit: 2021-01-29 | Discharge: 2021-01-29 | Disposition: A | Discharge status: Home / Self Care | Payer: PPO | Source: Ambulatory Visit | Attending: Obstetrics and Gynecology | Admitting: Obstetrics and Gynecology

## 2021-01-29 ENCOUNTER — Other Ambulatory Visit: Payer: Self-pay

## 2021-01-29 DIAGNOSIS — Z1231 Encounter for screening mammogram for malignant neoplasm of breast: Secondary | ICD-10-CM | POA: Diagnosis not present

## 2021-02-24 DIAGNOSIS — Z01419 Encounter for gynecological examination (general) (routine) without abnormal findings: Secondary | ICD-10-CM | POA: Diagnosis not present

## 2021-07-11 DIAGNOSIS — E6609 Other obesity due to excess calories: Secondary | ICD-10-CM | POA: Diagnosis not present

## 2021-07-11 DIAGNOSIS — S0990XA Unspecified injury of head, initial encounter: Secondary | ICD-10-CM | POA: Diagnosis not present

## 2021-07-11 DIAGNOSIS — I10 Essential (primary) hypertension: Secondary | ICD-10-CM | POA: Diagnosis not present

## 2021-07-11 DIAGNOSIS — E039 Hypothyroidism, unspecified: Secondary | ICD-10-CM | POA: Diagnosis not present

## 2021-07-11 DIAGNOSIS — Z23 Encounter for immunization: Secondary | ICD-10-CM | POA: Diagnosis not present

## 2021-07-31 DIAGNOSIS — R41841 Cognitive communication deficit: Secondary | ICD-10-CM | POA: Diagnosis not present

## 2021-07-31 DIAGNOSIS — S0630AS Unspecified focal traumatic brain injury with loss of consciousness status unknown, sequela: Secondary | ICD-10-CM | POA: Diagnosis not present

## 2021-10-02 DIAGNOSIS — S0630AS Unspecified focal traumatic brain injury with loss of consciousness status unknown, sequela: Secondary | ICD-10-CM | POA: Diagnosis not present

## 2021-10-02 DIAGNOSIS — E039 Hypothyroidism, unspecified: Secondary | ICD-10-CM | POA: Diagnosis not present

## 2021-10-02 DIAGNOSIS — I10 Essential (primary) hypertension: Secondary | ICD-10-CM | POA: Diagnosis not present

## 2021-10-03 ENCOUNTER — Ambulatory Visit: Payer: PPO | Admitting: Sports Medicine

## 2021-10-07 ENCOUNTER — Ambulatory Visit (INDEPENDENT_AMBULATORY_CARE_PROVIDER_SITE_OTHER): Payer: PPO

## 2021-10-07 ENCOUNTER — Encounter: Payer: Self-pay | Admitting: Sports Medicine

## 2021-10-07 ENCOUNTER — Other Ambulatory Visit: Payer: Self-pay

## 2021-10-07 ENCOUNTER — Ambulatory Visit: Payer: PPO | Admitting: Sports Medicine

## 2021-10-07 DIAGNOSIS — R32 Unspecified urinary incontinence: Secondary | ICD-10-CM | POA: Insufficient documentation

## 2021-10-07 DIAGNOSIS — N946 Dysmenorrhea, unspecified: Secondary | ICD-10-CM | POA: Insufficient documentation

## 2021-10-07 DIAGNOSIS — I749 Embolism and thrombosis of unspecified artery: Secondary | ICD-10-CM | POA: Insufficient documentation

## 2021-10-07 DIAGNOSIS — I69319 Unspecified symptoms and signs involving cognitive functions following cerebral infarction: Secondary | ICD-10-CM | POA: Insufficient documentation

## 2021-10-07 DIAGNOSIS — F411 Generalized anxiety disorder: Secondary | ICD-10-CM | POA: Insufficient documentation

## 2021-10-07 DIAGNOSIS — M2041 Other hammer toe(s) (acquired), right foot: Secondary | ICD-10-CM

## 2021-10-07 DIAGNOSIS — L84 Corns and callosities: Secondary | ICD-10-CM | POA: Diagnosis not present

## 2021-10-07 DIAGNOSIS — M778 Other enthesopathies, not elsewhere classified: Secondary | ICD-10-CM

## 2021-10-07 DIAGNOSIS — F339 Major depressive disorder, recurrent, unspecified: Secondary | ICD-10-CM | POA: Insufficient documentation

## 2021-10-07 DIAGNOSIS — K625 Hemorrhage of anus and rectum: Secondary | ICD-10-CM | POA: Insufficient documentation

## 2021-10-07 DIAGNOSIS — H1010 Acute atopic conjunctivitis, unspecified eye: Secondary | ICD-10-CM | POA: Insufficient documentation

## 2021-10-07 DIAGNOSIS — M549 Dorsalgia, unspecified: Secondary | ICD-10-CM | POA: Insufficient documentation

## 2021-10-07 NOTE — Progress Notes (Signed)
Subjective: Tiffany Hurley is a 46 y.o. female patient who presents to office for evaluation of Right foot pain at 4-5 toes, states that when she is in certain shoes it is sensitive and gets a little aggravated. Denies any other symptoms at this time.   Patient Active Problem List   Diagnosis Date Noted   Acute atopic conjunctivitis 10/07/2021   Anxiety state 10/07/2021   Arterial embolism (HCC) 10/07/2021   Backache 10/07/2021   Dysmenorrhea 10/07/2021   Hemorrhage of rectum and anus 10/07/2021   Recurrent major depression (HCC) 10/07/2021   Residual cognitive deficit as late effect of cerebrovascular accident 10/07/2021   Urinary incontinence 10/07/2021   History of closed head injury 11/09/2018   Memory difficulty 11/09/2018   Presence of IVC filter    Traumatic brain injury 05/19/2012    Current Outpatient Medications on File Prior to Visit  Medication Sig Dispense Refill   albuterol (PROAIR HFA) 108 (90 Base) MCG/ACT inhaler 2 puffs as needed     Ascorbic Acid (VITAMIN C) 500 MG CAPS      aspirin 325 MG EC tablet 1 tablet     ASPIRIN PO Take by mouth.     calcium carbonate (SUPER CALCIUM) 1500 (600 Ca) MG TABS tablet 1 tablet with meals     cefdinir (OMNICEF) 300 MG capsule      Cyanocobalamin (VITAMIN B 12 PO) Take by mouth.     Cyanocobalamin (VITAMIN B 12) 500 MCG TABS 4 tablet     doxycycline (PERIOSTAT) 20 MG tablet 1 tablet 1 hour before breakfast and 1 hour before the evening meal     Emollient (CERAVE DAILY MOISTURIZING) LOTN See admin instructions.     Ferrous Sulfate (IRON) 325 (65 Fe) MG TABS 1 tablet     metroNIDAZOLE (METROCREAM) 0.75 % cream 1 application     montelukast (SINGULAIR) 10 MG tablet Take 10 mg by mouth at bedtime.     Multiple Vitamin (MULTIVITAMIN) capsule Take 1 capsule by mouth daily.     pimecrolimus (ELIDEL) 1 % cream 1 application     Soap & Cleansers (CETAPHIL) LIQD See admin instructions.     triamcinolone cream (KENALOG) 0.1 %  1 application to affected area     VITAMIN D PO Take by mouth.     No current facility-administered medications on file prior to visit.    Allergies  Allergen Reactions   Codeine Nausea Only    Objective:  General: Alert and oriented x3 in no acute distress  Dermatology: Tiny hyperkeratotic lesion overlying medial 5th toe on left at intersapce, nails x 10 are well manicured.  Vascular: Dorsalis Pedis and Posterior Tibial pedal pulses 2/4, Capillary Fill Time 3 seconds,(+) pedal hair growth bilateral, no edema bilateral lower extremities, Temperature gradient within normal limits.  Neurology: Michaell Cowing sensation intact via light touch bilateral.  Musculoskeletal: Semi-flexible hammertoes 5 bilateral with Mild tenderness with palpation at PIPJ  and medial aspect of the toe L>R, Strength within normal limits in all groups bilateral.   Xrays  Right Foot    Impression: varus rotated hammertoe        Assessment and Plan: Problem List Items Addressed This Visit   None Visit Diagnoses     Capsulitis of right foot    -  Primary   Relevant Orders   DG Foot Complete Right   Hammer toe of right foot       Corn of toe            -  Complete examination performed -Xrays reviewed -Discussed treatement options for hammertoe with corn -At no charge using a 15 blade debrided corn at right 5th toe without incident  -Dispensed toe caps to use as directed -Recommend wider shoes to prevent crowding or rubbing  -Patient to return to office as needed after 1 month if wants surgery or sooner if condition worsens.  Asencion Islam, DPM

## 2021-10-07 NOTE — Addendum Note (Signed)
Addended by: Cranford Mon R on: 10/07/2021 01:06 PM   Modules accepted: Orders

## 2021-10-29 IMAGING — MG MM DIGITAL SCREENING BILAT W/ TOMO AND CAD
8 series · 9 of 24 positions shown · non-contrast
Comparison: Previous exam(s).

CLINICAL DATA: Screening.

EXAM:
DIGITAL SCREENING BILATERAL MAMMOGRAM WITH TOMOSYNTHESIS AND CAD
TECHNIQUE: Bilateral screening digital craniocaudal and mediolateral oblique
mammograms were obtained. Bilateral screening digital breast
tomosynthesis was performed. The images were evaluated with
computer-aided detection.

[L MLO synth-2D]
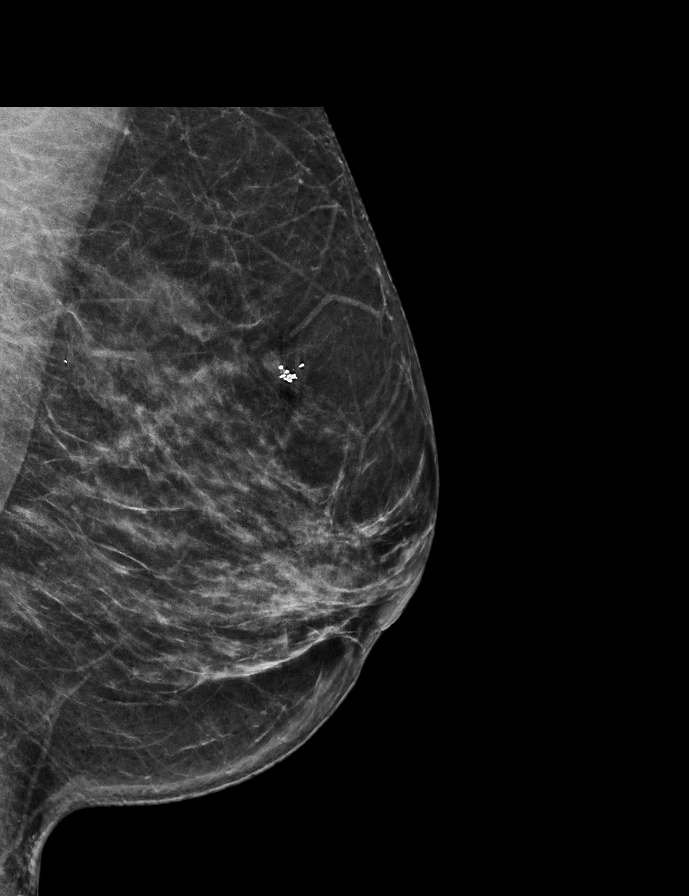

[R MLO synth-2D]
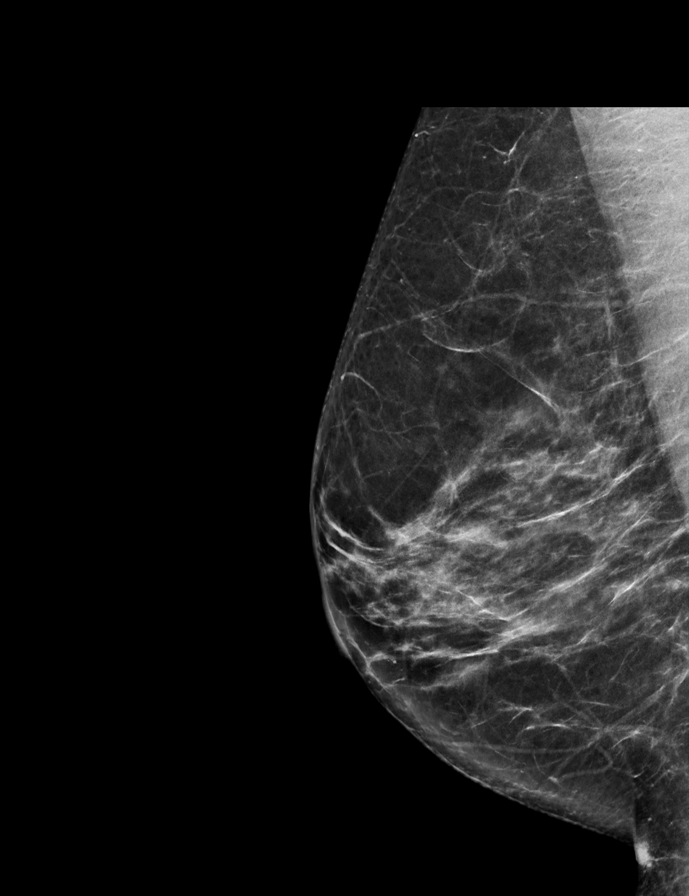

[L CC synth-2D]
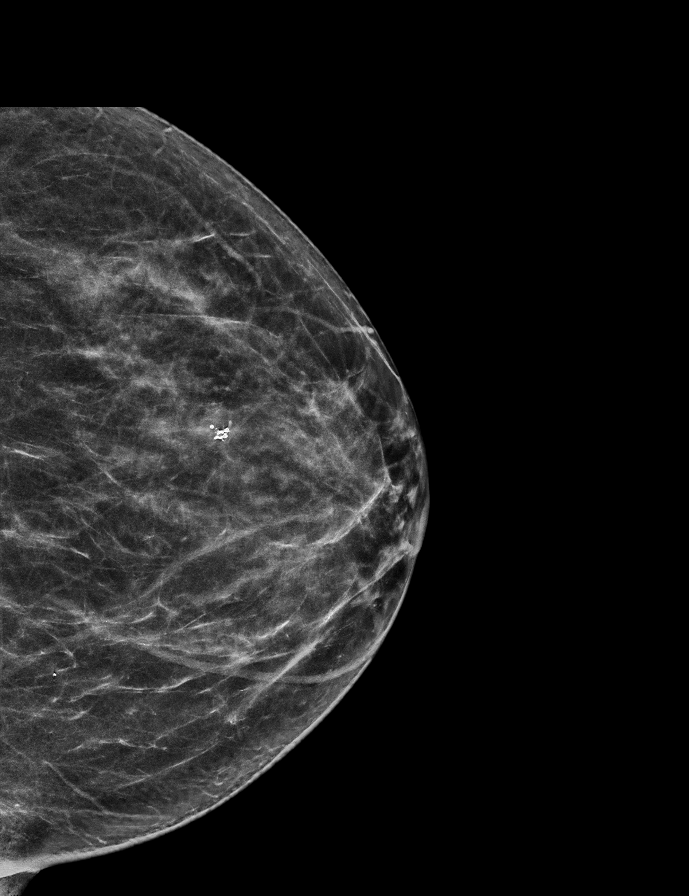

[R CC synth-2D]
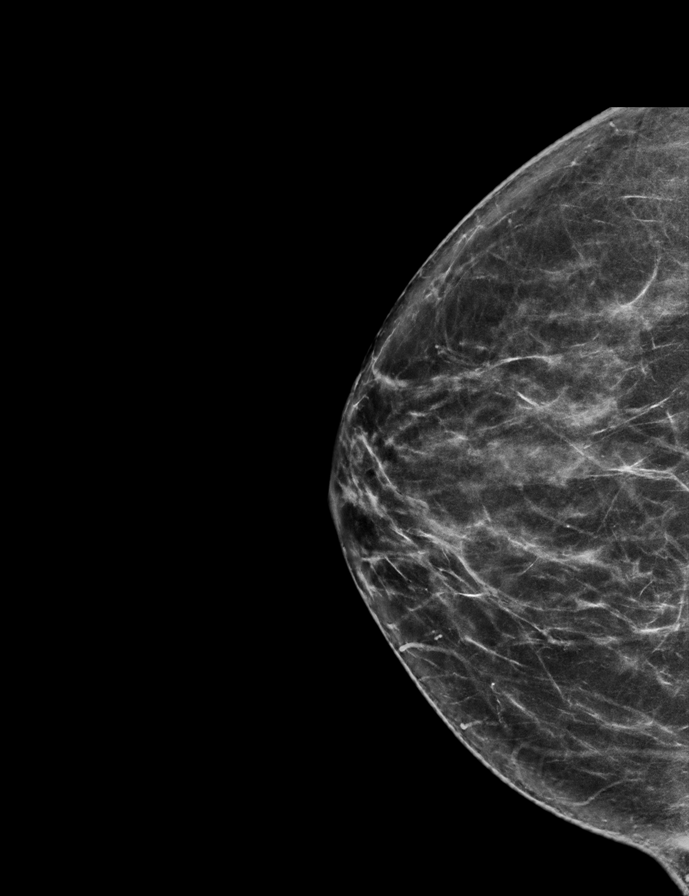

[R MLO tomo · 2 of 73 frames shown]
[frame 24/73]
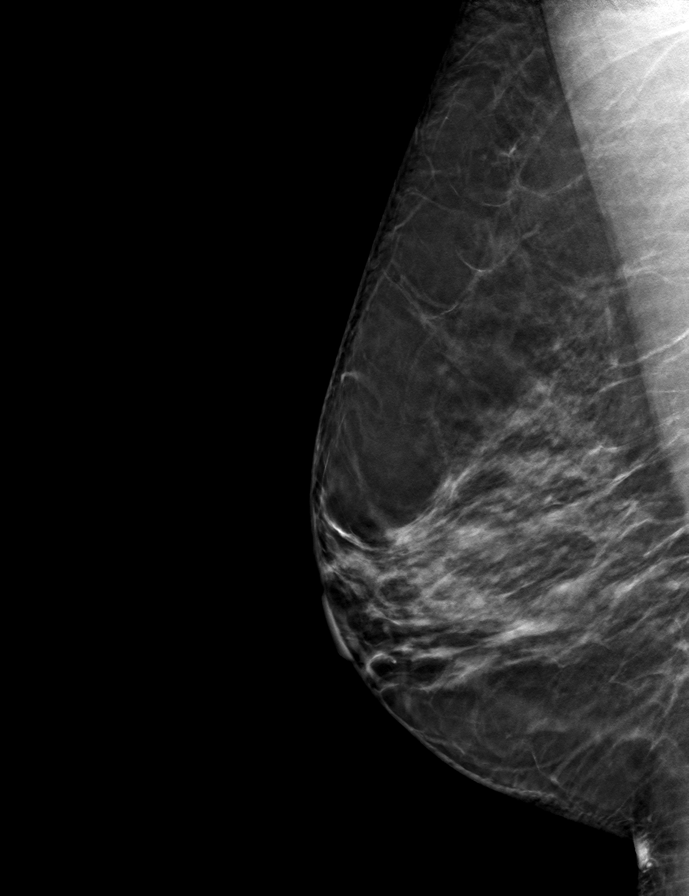
[frame 37/73]
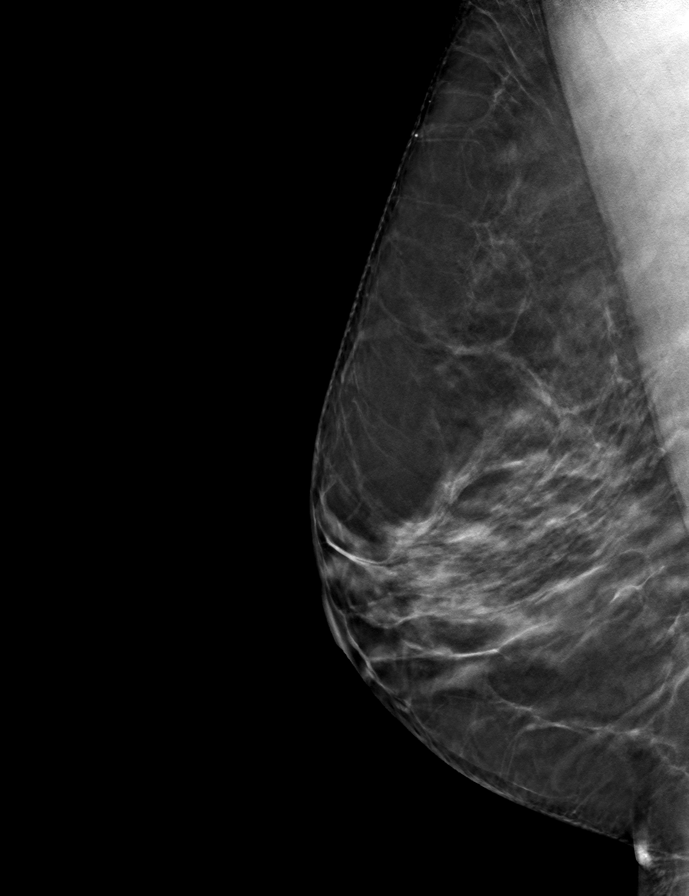

[R CC tomo · tomo slice 36/71.0]
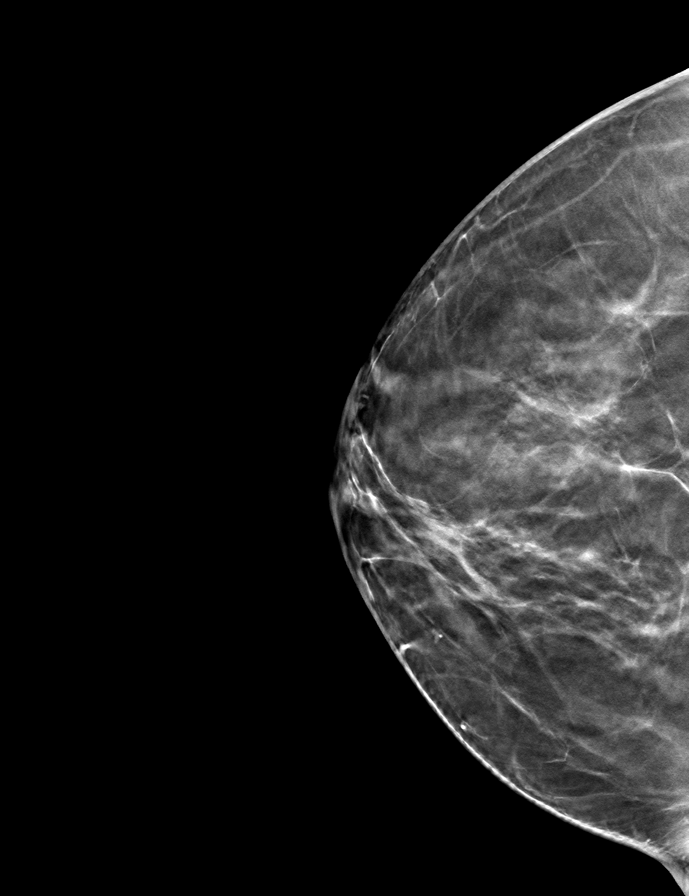

[L MLO tomo · tomo slice 32/63.0]
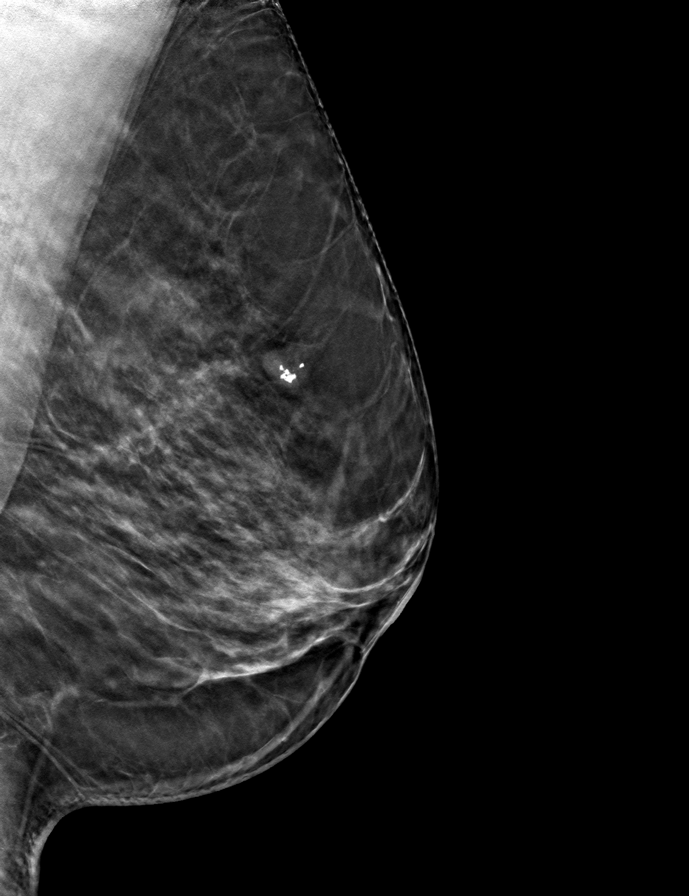

[L CC tomo · tomo slice 29/58.0]
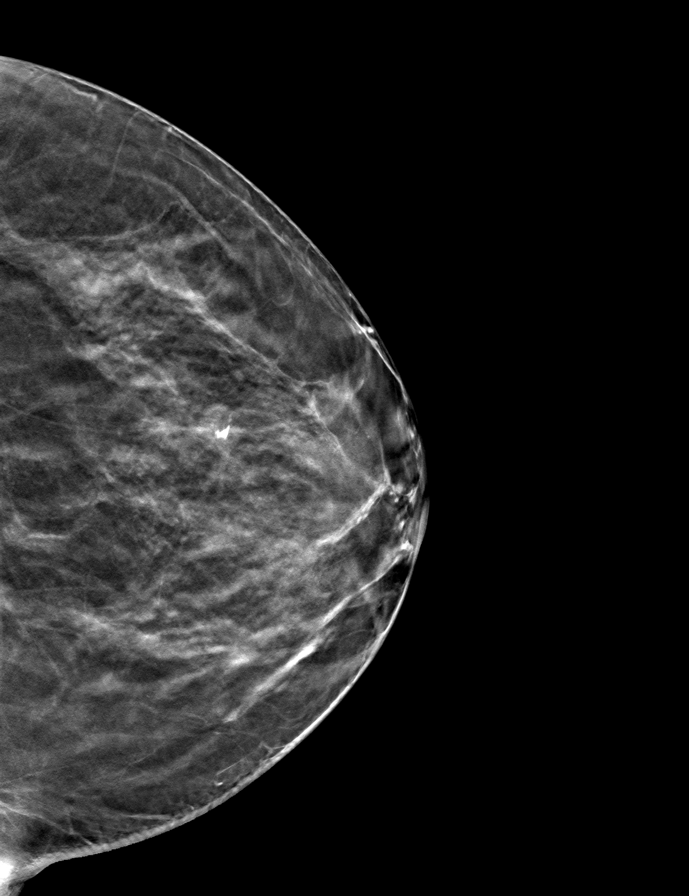

[9 of 24 positions shown; findings below may reference images not displayed]

ACR Breast Density Category c: The breast tissue is heterogeneously
dense, which may obscure small masses.
FINDINGS: There are no findings suspicious for malignancy. The images were
evaluated with computer-aided detection.
IMPRESSION: No mammographic evidence of malignancy. A result letter of this
screening mammogram will be mailed directly to the patient.

RECOMMENDATION:
Screening mammogram in one year. (Code:T4-5-GWO)

BI-RADS CATEGORY  1: Negative.

## 2022-01-07 DIAGNOSIS — R41841 Cognitive communication deficit: Secondary | ICD-10-CM | POA: Diagnosis not present

## 2022-01-07 DIAGNOSIS — I10 Essential (primary) hypertension: Secondary | ICD-10-CM | POA: Diagnosis not present

## 2022-01-07 DIAGNOSIS — E039 Hypothyroidism, unspecified: Secondary | ICD-10-CM | POA: Diagnosis not present

## 2022-02-16 DIAGNOSIS — F4322 Adjustment disorder with anxiety: Secondary | ICD-10-CM | POA: Diagnosis not present

## 2022-03-12 ENCOUNTER — Other Ambulatory Visit: Payer: Self-pay | Admitting: Family Medicine

## 2022-03-12 DIAGNOSIS — Z1231 Encounter for screening mammogram for malignant neoplasm of breast: Secondary | ICD-10-CM

## 2022-03-16 DIAGNOSIS — H25813 Combined forms of age-related cataract, bilateral: Secondary | ICD-10-CM | POA: Diagnosis not present

## 2022-04-01 DIAGNOSIS — F4322 Adjustment disorder with anxiety: Secondary | ICD-10-CM | POA: Diagnosis not present

## 2022-04-07 ENCOUNTER — Ambulatory Visit: Payer: PPO

## 2022-04-16 DIAGNOSIS — M9903 Segmental and somatic dysfunction of lumbar region: Secondary | ICD-10-CM | POA: Diagnosis not present

## 2022-04-16 DIAGNOSIS — M9904 Segmental and somatic dysfunction of sacral region: Secondary | ICD-10-CM | POA: Diagnosis not present

## 2022-04-16 DIAGNOSIS — M9902 Segmental and somatic dysfunction of thoracic region: Secondary | ICD-10-CM | POA: Diagnosis not present

## 2022-04-16 DIAGNOSIS — M62830 Muscle spasm of back: Secondary | ICD-10-CM | POA: Diagnosis not present

## 2022-04-16 DIAGNOSIS — M5417 Radiculopathy, lumbosacral region: Secondary | ICD-10-CM | POA: Diagnosis not present

## 2022-04-23 DIAGNOSIS — M5417 Radiculopathy, lumbosacral region: Secondary | ICD-10-CM | POA: Diagnosis not present

## 2022-04-23 DIAGNOSIS — M9902 Segmental and somatic dysfunction of thoracic region: Secondary | ICD-10-CM | POA: Diagnosis not present

## 2022-04-23 DIAGNOSIS — M62830 Muscle spasm of back: Secondary | ICD-10-CM | POA: Diagnosis not present

## 2022-04-23 DIAGNOSIS — M9904 Segmental and somatic dysfunction of sacral region: Secondary | ICD-10-CM | POA: Diagnosis not present

## 2022-04-23 DIAGNOSIS — M9903 Segmental and somatic dysfunction of lumbar region: Secondary | ICD-10-CM | POA: Diagnosis not present

## 2022-04-28 ENCOUNTER — Encounter (HOSPITAL_COMMUNITY): Payer: Self-pay | Admitting: Emergency Medicine

## 2022-04-28 ENCOUNTER — Emergency Department (HOSPITAL_COMMUNITY)
Admission: EM | Admit: 2022-04-28 | Discharge: 2022-04-28 | Disposition: A | Discharge status: Home / Self Care | Payer: PPO | Attending: Student | Admitting: Student

## 2022-04-28 DIAGNOSIS — Z23 Encounter for immunization: Secondary | ICD-10-CM | POA: Diagnosis not present

## 2022-04-28 DIAGNOSIS — S40861A Insect bite (nonvenomous) of right upper arm, initial encounter: Secondary | ICD-10-CM | POA: Diagnosis not present

## 2022-04-28 DIAGNOSIS — R21 Rash and other nonspecific skin eruption: Secondary | ICD-10-CM | POA: Insufficient documentation

## 2022-04-28 DIAGNOSIS — Z7982 Long term (current) use of aspirin: Secondary | ICD-10-CM | POA: Insufficient documentation

## 2022-04-28 DIAGNOSIS — S40862A Insect bite (nonvenomous) of left upper arm, initial encounter: Secondary | ICD-10-CM | POA: Diagnosis not present

## 2022-04-28 DIAGNOSIS — W57XXXA Bitten or stung by nonvenomous insect and other nonvenomous arthropods, initial encounter: Secondary | ICD-10-CM | POA: Diagnosis not present

## 2022-04-28 MED ORDER — IBUPROFEN 200 MG PO TABS
600.0000 mg | ORAL_TABLET | Freq: Once | ORAL | Status: AC
  Administered 2022-04-28: 600 mg via ORAL
  Filled 2022-04-28: qty 3

## 2022-04-28 MED ORDER — TETANUS-DIPHTH-ACELL PERTUSSIS 5-2.5-18.5 LF-MCG/0.5 IM SUSY
0.5000 mL | PREFILLED_SYRINGE | Freq: Once | INTRAMUSCULAR | Status: AC
  Administered 2022-04-28: 0.5 mL via INTRAMUSCULAR
  Filled 2022-04-28: qty 0.5

## 2022-04-28 NOTE — ED Provider Notes (Signed)
[facility]-EMERGENCY DEPT Provider Note   CSN: [id number] Arrival date & time: 04/28/22  2026     History  Chief Complaint  Patient presents with   Insect Bite    Tiffany Hurley is a 46 y.o. female.  HPI   Without significant medical history presents with complaints of being stung by multiple yellow jackets.  Patient says happened around 7 PM today, states that she thought it was a smaller nest but it was much larger and she got stung multiple times on her right arm and  head.  She states that the areas are burning she denies any tongue throat lip swelling difficulty breathing, no nausea or vomiting, states that she has been stung in the past has never had a anaphylactic reaction.  She is up-to-date on her tetanus shot, she not immunocompromise, she has no other complaints.    Home Medications Prior to Admission medications   Medication Sig Start Date End Date Taking? Authorizing Provider  albuterol (PROAIR HFA) 108 (90 Base) MCG/ACT inhaler 2 puffs as needed    [provider]  Ascorbic Acid (VITAMIN C) 500 MG CAPS     [provider]  aspirin 325 MG EC tablet 1 tablet    [provider]  ASPIRIN PO Take by mouth.    [provider]  calcium carbonate (SUPER CALCIUM) 1500 (600 Ca) MG TABS tablet 1 tablet with meals    [provider]  cefdinir (OMNICEF) 300 MG capsule  11/06/18   [provider]  Cyanocobalamin (VITAMIN B 12 PO) Take by mouth.    [provider]  Cyanocobalamin (VITAMIN B 12) 500 MCG TABS 4 tablet    [provider]  doxycycline (PERIOSTAT) 20 MG tablet 1 tablet 1 hour before breakfast and 1 hour before the evening meal    [provider]  Emollient (CERAVE DAILY MOISTURIZING) LOTN See admin instructions.    [provider]  Ferrous Sulfate (IRON) 325 (65 Fe) MG TABS 1 tablet    [provider]  montelukast (SINGULAIR) 10 MG tablet  Take 10 mg by mouth at bedtime.    [provider]  Multiple Vitamin (MULTIVITAMIN) capsule Take 1 capsule by mouth daily.    [provider]  pimecrolimus (ELIDEL) 1 % cream 1 application    [provider]  Soap & Cleansers (CETAPHIL) LIQD See admin instructions.    [provider]  triamcinolone cream (KENALOG) 0.1 % 1 application to affected area    [provider]  VITAMIN D PO Take by mouth.    [provider]      Allergies    Codeine    Review of Systems   Review of Systems  Constitutional:  Negative for chills and fever.  Respiratory:  Negative for shortness of breath.   Cardiovascular:  Negative for chest pain.  Gastrointestinal:  Negative for abdominal pain.  Skin:  Positive for wound.  Neurological:  Negative for headaches.    Physical Exam Updated Vital Signs BP (!) 143/92 (BP Location: Left Arm)   Pulse 85   Temp 98.1 F (36.7 C) (Oral)   Resp 18   Ht 5\' 4"  (1.626 m)   Wt 84.4 kg   SpO2 100%   BMI 31.93 kg/m  Physical Exam Vitals and nursing note reviewed.  Constitutional:      General: She is not in acute distress.    Appearance: She is not ill-appearing.  HENT:  Head: Normocephalic and atraumatic.     Nose: No congestion.     Mouth/Throat:     Mouth: Mucous membranes are moist.     Pharynx: Oropharynx is clear.     Comments: No trismus no torticollis no oral edema present tongue uvula midline controlling secretions tonsils equal symmetric bilaterally no submandibular swelling no change in voice. Eyes:     Conjunctiva/sclera: Conjunctivae normal.  Cardiovascular:     Rate and Rhythm: Normal rate and regular rhythm.     Pulses: Normal pulses.     Heart sounds: No murmur heard.    No friction rub. No gallop.  Pulmonary:     Effort: No respiratory distress.     Breath sounds: No wheezing, rhonchi or rales.  Skin:    General: Skin is warm and dry.     Comments: Patient has few small welts  noted on her head as well as her right arm no systemic rash.  Neurological:     Mental Status: She is alert.  Psychiatric:        Mood and Affect: Mood normal.     ED Results / Procedures / Treatments   Labs (all labs ordered are listed, but only abnormal results are displayed) Labs Reviewed - No data to display  EKG None  Radiology No results found.  Procedures Procedures    Medications Ordered in ED Medications  Tdap (BOOSTRIX) injection 0.5 mL (0.5 mLs Intramuscular Given 04/28/22 2231)  ibuprofen (ADVIL) tablet 600 mg (600 mg Oral Given 04/28/22 2231)    ED Course/ Medical Decision Making/ A&P                           Medical Decision Making Risk OTC drugs. Prescription drug management.   This patient presents to the ED for concern of insect bite, this involves an extensive number of treatment options, and is a complaint that carries with it a high risk of complications and morbidity.  The differential diagnosis includes anaphylaxis, angioedema, cellulitis    Additional history obtained:  Additional history obtained from son at bedside External records from outside source obtained and reviewed including immunization records   Co morbidities that complicate the patient evaluation  N/A  Social Determinants of Health:  N/A    Lab Tests:  I Ordered, and personally interpreted labs.  The pertinent results include: N/A   Imaging Studies ordered:  I ordered imaging studies including N/A I independently visualized and interpreted imaging which showed N/A I agree with the radiologist interpretation   Cardiac Monitoring:  The patient was maintained on a cardiac monitor.  I personally viewed and interpreted the cardiac monitored which showed an underlying rhythm of: N/A   Medicines ordered and prescription drug management:  I ordered medication including ibuprofen, Tdap I have reviewed the patients home medicines and have made adjustments as  needed  Critical Interventions:  N/A   Reevaluation:  Presents with insect bites, exam was benign, she has no complaints, agreement with plan and discharge at this time.  Consultations Obtained:  N/A  Test Considered:  N/A    Rule out Low suspicion for angioedema or anaphylaxis as there is no oral involvement, vital signs are reassuring, no systemic rash. Low  Suspicion for cellulitis is to be atypical to develop cellulitis and less than 24 hours, there is also no evidence of infection present my exam.    Dispostion and problem list  After consideration of the diagnostic results  and the patients response to treatment, I feel that the patent would benefit from discharge.  Insect bite-we will recommend symptom management, follow-up PCP as needed strict return precautions.            Final Clinical Impression(s) / ED Diagnoses Final diagnoses:  Insect bite, unspecified site, initial encounter    Rx / DC Orders ED Discharge Orders     None         Barnie Del 04/28/22 2236    Gloris Manchester, MD 04/30/22 [phone]

## 2022-04-28 NOTE — ED Notes (Signed)
I provided reinforced discharge education based off of after visit summary/care provided. Pt acknowledged and understood my education. Pt had no further questions/concerns for provider/myself. After visit summary provided to pt. 

## 2022-04-28 NOTE — Discharge Instructions (Signed)
I recommend ibuprofen and Tylenol as needed for pain, you may also take cold showers to help calm down the inflammatory response.  For itchiness you may use ibuprofen or Claritin as needed.  You may also apply hydrocortisone cream directly to the area.  May follow-up with your PCP as needed  Come back to the emergency department if you develop chest pain, shortness of breath, severe abdominal pain, uncontrolled nausea, vomiting, diarrhea.

## 2022-04-28 NOTE — ED Triage Notes (Signed)
Pt states that she was stung by about 30 yellow jackets around 7p. Denies allergy. No vomiting, SOB, or itching noted. A&Ox4. Most pain in her scalp. Some welts noted to forearms and hands.

## 2022-04-30 ENCOUNTER — Ambulatory Visit
Admission: RE | Admit: 2022-04-30 | Discharge: 2022-04-30 | Disposition: A | Discharge status: Home / Self Care | Payer: PPO | Source: Ambulatory Visit | Attending: Family Medicine | Admitting: Family Medicine

## 2022-04-30 DIAGNOSIS — Z1231 Encounter for screening mammogram for malignant neoplasm of breast: Secondary | ICD-10-CM

## 2022-05-07 DIAGNOSIS — M9902 Segmental and somatic dysfunction of thoracic region: Secondary | ICD-10-CM | POA: Diagnosis not present

## 2022-05-07 DIAGNOSIS — M5417 Radiculopathy, lumbosacral region: Secondary | ICD-10-CM | POA: Diagnosis not present

## 2022-05-07 DIAGNOSIS — M9903 Segmental and somatic dysfunction of lumbar region: Secondary | ICD-10-CM | POA: Diagnosis not present

## 2022-05-07 DIAGNOSIS — M62830 Muscle spasm of back: Secondary | ICD-10-CM | POA: Diagnosis not present

## 2022-05-07 DIAGNOSIS — M9904 Segmental and somatic dysfunction of sacral region: Secondary | ICD-10-CM | POA: Diagnosis not present

## 2022-05-08 DIAGNOSIS — Z23 Encounter for immunization: Secondary | ICD-10-CM | POA: Diagnosis not present

## 2022-05-08 DIAGNOSIS — S0630AS Unspecified focal traumatic brain injury with loss of consciousness status unknown, sequela: Secondary | ICD-10-CM | POA: Diagnosis not present

## 2022-05-08 DIAGNOSIS — E039 Hypothyroidism, unspecified: Secondary | ICD-10-CM | POA: Diagnosis not present

## 2022-05-08 DIAGNOSIS — R41841 Cognitive communication deficit: Secondary | ICD-10-CM | POA: Diagnosis not present

## 2022-05-08 DIAGNOSIS — I10 Essential (primary) hypertension: Secondary | ICD-10-CM | POA: Diagnosis not present

## 2022-05-22 ENCOUNTER — Emergency Department (HOSPITAL_COMMUNITY)
Admission: EM | Admit: 2022-05-22 | Discharge: 2022-05-22 | Disposition: A | Discharge status: Home / Self Care | Payer: PPO | Attending: Emergency Medicine | Admitting: Emergency Medicine

## 2022-05-22 ENCOUNTER — Encounter (HOSPITAL_COMMUNITY): Payer: Self-pay | Admitting: Emergency Medicine

## 2022-05-22 DIAGNOSIS — R221 Localized swelling, mass and lump, neck: Secondary | ICD-10-CM | POA: Insufficient documentation

## 2022-05-22 DIAGNOSIS — Z7982 Long term (current) use of aspirin: Secondary | ICD-10-CM | POA: Diagnosis not present

## 2022-05-22 DIAGNOSIS — M542 Cervicalgia: Secondary | ICD-10-CM | POA: Diagnosis present

## 2022-05-22 NOTE — ED Provider Notes (Signed)
[facility]EM[facility] Provider Note   CSN: [id number] Arrival date & time: 05/22/22  1521     History  Chief Complaint  Patient presents with   Neck Pain    Tiffany Hurley is a 46 y.o. female.   Neck Pain Patient is a 46 year old female presented to the emergency room today with complaints of left-sided anterior neck lump she states that she has had some discomfort in this area and some swelling for the past 2 weeks.  She denies any difficulty swallowing or breathing.  She states that she has not had any significant sudden changes in this area denies any fevers or voice changes.  She does state that she may have had some changes in her skin and hair but denies any chest pain, heart palpitations lightheadedness dizziness, fevers or any other significant associated symptoms.  She did briefly have a sore throat for a few days however this is resolved.     Home Medications Prior to Admission medications   Medication Sig Start Date End Date Taking? Authorizing Provider  albuterol (PROAIR HFA) 108 (90 Base) MCG/ACT inhaler 2 puffs as needed    [provider]  Ascorbic Acid (VITAMIN C) 500 MG CAPS     [provider]  aspirin 325 MG EC tablet 1 tablet    [provider]  ASPIRIN PO Take by mouth.    [provider]  calcium carbonate (SUPER CALCIUM) 1500 (600 Ca) MG TABS tablet 1 tablet with meals    [provider]  cefdinir (OMNICEF) 300 MG capsule  11/06/18   [provider]  Cyanocobalamin (VITAMIN B 12 PO) Take by mouth.    [provider]  Cyanocobalamin (VITAMIN B 12) 500 MCG TABS 4 tablet    [provider]  doxycycline (PERIOSTAT) 20 MG tablet 1 tablet 1 hour before breakfast and 1 hour before the evening meal    [provider]  Emollient (CERAVE DAILY MOISTURIZING) LOTN See admin instructions.    [provider]  Ferrous Sulfate (IRON) 325 (65 Fe) MG  TABS 1 tablet    [provider]  montelukast (SINGULAIR) 10 MG tablet Take 10 mg by mouth at bedtime.    [provider]  Multiple Vitamin (MULTIVITAMIN) capsule Take 1 capsule by mouth daily.    [provider]  pimecrolimus (ELIDEL) 1 % cream 1 application    [provider]  Soap & Cleansers (CETAPHIL) LIQD See admin instructions.    [provider]  triamcinolone cream (KENALOG) 0.1 % 1 application to affected area    [provider]  VITAMIN D PO Take by mouth.    [provider]      Allergies    Codeine    Review of Systems   Review of Systems  Musculoskeletal:  Positive for neck pain.    Physical Exam Updated Vital Signs BP (!) 125/91   Pulse 96   Temp 99.3 F (37.4 C) (Oral)   Resp 18   LMP 05/13/2022   SpO2 100%  Physical Exam Vitals and nursing note reviewed.  Constitutional:      General: She is not in acute distress.    Appearance: Normal appearance. She is not ill-appearing.  HENT:     Head: Normocephalic and atraumatic.     Mouth/Throat:     Comments: Posterior pharynx without erythema.  Uvula midline.  Normal phonation  External neck with no tracheal deviation.  There is some tenderness over  the left lobe of the thyroid and a small lump without fluctuance or cellulitic changes Eyes:     General: No scleral icterus.       Right eye: No discharge.        Left eye: No discharge.     Conjunctiva/sclera: Conjunctivae normal.  Pulmonary:     Effort: Pulmonary effort is normal.     Breath sounds: No stridor.  Neurological:     Mental Status: She is alert and oriented to person, place, and time. Mental status is at baseline.     ED Results / Procedures / Treatments   Labs (all labs ordered are listed, but only abnormal results are displayed) Labs Reviewed - No data to display  EKG None  Radiology No results found.  Procedures Procedures    Medications Ordered in ED Medications -  No data to display  ED Course/ Medical Decision Making/ A&P                           Medical Decision Making  Patient is a 46 year old female presented to the emergency room today with complaints of left-sided anterior neck lump she states that she has had some discomfort in this area and some swelling for the past 2 weeks.  She denies any difficulty swallowing or breathing.  She states that she has not had any significant sudden changes in this area denies any fevers or voice changes.  She does state that she may have had some changes in her skin and hair but denies any chest pain, heart palpitations lightheadedness dizziness, fevers or any other significant associated symptoms.  She did briefly have a sore throat for a few days however this is resolved.  There is some tenderness over the left lobe of the thyroid and a small lump without fluctuance or cellulitic changes consistent with an underlying mass.  Briefly discussed with my today physician who was negative with my plan to recommend close follow-up with PCP.  Would likely benefit from a outpatient ultrasound.  Very strict return precautions were discussed for any symptoms of hyperthyroidism/thyroid storm.  Clinically she is not proptotic or tachycardic she has vital signs that are within normal limits and overall well-appearing with normal phonation I doubt soft tissue infection.   She understands and agrees to return precautions.  Will discharge home at this time.  Final Clinical Impression(s) / ED Diagnoses Final diagnoses:  Lump in neck    Rx / DC Orders ED Discharge Orders     None         [name], [name] 05/22/22 2339    Maia Plan, MD 05/27/22 [phone]

## 2022-05-22 NOTE — Discharge Instructions (Signed)
Please follow-up with your primary care provider.  If able, I would contact them and let them know that you were seen in the emergency department and there is concern for a possible thyroid mass.  An ultrasound is a reasonable next step for evaluation of this.  Your primary care provider may be able to schedule you for this prior to seeing you.  They may need to see you first.  Return to the emergency room for any difficulty breathing, swelling, worsening symptoms, or any other new or concerning symptoms.

## 2022-05-22 NOTE — ED Triage Notes (Addendum)
Patient c/o tender L neck with some swelling x2 weeks. Denies SOB and difficulty breathing.

## 2022-05-26 ENCOUNTER — Other Ambulatory Visit: Payer: Self-pay | Admitting: Family Medicine

## 2022-05-26 DIAGNOSIS — M6289 Other specified disorders of muscle: Secondary | ICD-10-CM | POA: Diagnosis not present

## 2022-05-26 DIAGNOSIS — R070 Pain in throat: Secondary | ICD-10-CM | POA: Diagnosis not present

## 2022-06-02 ENCOUNTER — Ambulatory Visit
Admission: RE | Admit: 2022-06-02 | Discharge: 2022-06-02 | Disposition: A | Discharge status: Home / Self Care | Payer: PPO | Source: Ambulatory Visit | Attending: Family Medicine | Admitting: Family Medicine

## 2022-06-02 DIAGNOSIS — M542 Cervicalgia: Secondary | ICD-10-CM | POA: Diagnosis not present

## 2022-06-02 DIAGNOSIS — R070 Pain in throat: Secondary | ICD-10-CM

## 2022-08-14 DIAGNOSIS — J399 Disease of upper respiratory tract, unspecified: Secondary | ICD-10-CM | POA: Diagnosis not present

## 2022-08-14 DIAGNOSIS — E039 Hypothyroidism, unspecified: Secondary | ICD-10-CM | POA: Diagnosis not present

## 2022-08-14 DIAGNOSIS — I10 Essential (primary) hypertension: Secondary | ICD-10-CM | POA: Diagnosis not present

## 2022-08-14 DIAGNOSIS — R259 Unspecified abnormal involuntary movements: Secondary | ICD-10-CM | POA: Diagnosis not present

## 2022-10-09 ENCOUNTER — Other Ambulatory Visit: Payer: Self-pay | Admitting: Obstetrics and Gynecology

## 2022-10-09 ENCOUNTER — Other Ambulatory Visit (HOSPITAL_COMMUNITY)
Admission: RE | Admit: 2022-10-09 | Discharge: 2022-10-09 | Disposition: A | Discharge status: Home / Self Care | Payer: PPO | Source: Ambulatory Visit | Attending: Obstetrics and Gynecology | Admitting: Obstetrics and Gynecology

## 2022-10-09 DIAGNOSIS — Z1151 Encounter for screening for human papillomavirus (HPV): Secondary | ICD-10-CM | POA: Diagnosis not present

## 2022-10-09 DIAGNOSIS — Z8619 Personal history of other infectious and parasitic diseases: Secondary | ICD-10-CM | POA: Diagnosis not present

## 2022-10-09 DIAGNOSIS — Z9189 Other specified personal risk factors, not elsewhere classified: Secondary | ICD-10-CM | POA: Diagnosis not present

## 2022-10-09 DIAGNOSIS — Z01419 Encounter for gynecological examination (general) (routine) without abnormal findings: Secondary | ICD-10-CM | POA: Insufficient documentation

## 2022-10-13 LAB — CYTOLOGY - PAP
Comment: NEGATIVE
Diagnosis: NEGATIVE
High risk HPV: NEGATIVE

## 2022-12-23 DIAGNOSIS — I10 Essential (primary) hypertension: Secondary | ICD-10-CM | POA: Diagnosis not present

## 2022-12-23 DIAGNOSIS — E039 Hypothyroidism, unspecified: Secondary | ICD-10-CM | POA: Diagnosis not present

## 2022-12-29 DIAGNOSIS — S0630AS Unspecified focal traumatic brain injury with loss of consciousness status unknown, sequela: Secondary | ICD-10-CM | POA: Diagnosis not present

## 2022-12-29 DIAGNOSIS — E039 Hypothyroidism, unspecified: Secondary | ICD-10-CM | POA: Diagnosis not present

## 2022-12-29 DIAGNOSIS — R41841 Cognitive communication deficit: Secondary | ICD-10-CM | POA: Diagnosis not present

## 2022-12-29 DIAGNOSIS — E6609 Other obesity due to excess calories: Secondary | ICD-10-CM | POA: Diagnosis not present

## 2022-12-29 DIAGNOSIS — I10 Essential (primary) hypertension: Secondary | ICD-10-CM | POA: Diagnosis not present

## 2023-04-06 DIAGNOSIS — Z1211 Encounter for screening for malignant neoplasm of colon: Secondary | ICD-10-CM | POA: Diagnosis not present

## 2023-04-08 ENCOUNTER — Other Ambulatory Visit: Payer: Self-pay | Admitting: Family Medicine

## 2023-04-08 DIAGNOSIS — Z1231 Encounter for screening mammogram for malignant neoplasm of breast: Secondary | ICD-10-CM

## 2023-04-08 DIAGNOSIS — H25813 Combined forms of age-related cataract, bilateral: Secondary | ICD-10-CM | POA: Diagnosis not present

## 2023-04-30 DIAGNOSIS — Z1211 Encounter for screening for malignant neoplasm of colon: Secondary | ICD-10-CM | POA: Diagnosis not present

## 2023-05-05 ENCOUNTER — Ambulatory Visit
Admission: RE | Admit: 2023-05-05 | Discharge: 2023-05-05 | Disposition: A | Discharge status: Home / Self Care | Payer: PPO | Source: Ambulatory Visit | Attending: Family Medicine | Admitting: Family Medicine

## 2023-05-05 DIAGNOSIS — Z1231 Encounter for screening mammogram for malignant neoplasm of breast: Secondary | ICD-10-CM

## 2023-05-07 ENCOUNTER — Other Ambulatory Visit: Payer: Self-pay | Admitting: Family Medicine

## 2023-05-07 DIAGNOSIS — R928 Other abnormal and inconclusive findings on diagnostic imaging of breast: Secondary | ICD-10-CM

## 2023-05-07 DIAGNOSIS — R41841 Cognitive communication deficit: Secondary | ICD-10-CM | POA: Diagnosis not present

## 2023-05-07 DIAGNOSIS — I10 Essential (primary) hypertension: Secondary | ICD-10-CM | POA: Diagnosis not present

## 2023-05-07 DIAGNOSIS — E039 Hypothyroidism, unspecified: Secondary | ICD-10-CM | POA: Diagnosis not present

## 2023-05-14 ENCOUNTER — Ambulatory Visit
Admission: RE | Admit: 2023-05-14 | Discharge: 2023-05-14 | Disposition: A | Discharge status: Home / Self Care | Payer: PPO | Source: Ambulatory Visit | Attending: Family Medicine | Admitting: Family Medicine

## 2023-05-14 DIAGNOSIS — N6325 Unspecified lump in the left breast, overlapping quadrants: Secondary | ICD-10-CM | POA: Diagnosis not present

## 2023-05-14 DIAGNOSIS — N6321 Unspecified lump in the left breast, upper outer quadrant: Secondary | ICD-10-CM | POA: Diagnosis not present

## 2023-05-14 DIAGNOSIS — R928 Other abnormal and inconclusive findings on diagnostic imaging of breast: Secondary | ICD-10-CM

## 2023-05-14 DIAGNOSIS — N6320 Unspecified lump in the left breast, unspecified quadrant: Secondary | ICD-10-CM | POA: Diagnosis not present

## 2023-05-18 ENCOUNTER — Other Ambulatory Visit: Payer: PPO

## 2023-05-20 ENCOUNTER — Other Ambulatory Visit: Payer: Self-pay | Admitting: Family Medicine

## 2023-05-20 DIAGNOSIS — N6320 Unspecified lump in the left breast, unspecified quadrant: Secondary | ICD-10-CM

## 2023-05-24 ENCOUNTER — Other Ambulatory Visit: Payer: PPO

## 2023-05-25 ENCOUNTER — Encounter: Payer: Self-pay | Admitting: Family Medicine

## 2023-06-16 DIAGNOSIS — Z309 Encounter for contraceptive management, unspecified: Secondary | ICD-10-CM | POA: Diagnosis not present

## 2023-07-08 DIAGNOSIS — S0630AS Unspecified focal traumatic brain injury with loss of consciousness status unknown, sequela: Secondary | ICD-10-CM | POA: Diagnosis not present

## 2023-07-08 DIAGNOSIS — I10 Essential (primary) hypertension: Secondary | ICD-10-CM | POA: Diagnosis not present

## 2023-07-08 DIAGNOSIS — Z20828 Contact with and (suspected) exposure to other viral communicable diseases: Secondary | ICD-10-CM | POA: Diagnosis not present

## 2023-08-26 ENCOUNTER — Emergency Department (HOSPITAL_COMMUNITY)
Admission: EM | Admit: 2023-08-26 | Discharge: 2023-08-26 | Disposition: A | Discharge status: Home / Self Care | Payer: PPO | Attending: Emergency Medicine | Admitting: Emergency Medicine

## 2023-08-26 ENCOUNTER — Other Ambulatory Visit: Payer: Self-pay

## 2023-08-26 ENCOUNTER — Encounter (HOSPITAL_COMMUNITY): Payer: Self-pay

## 2023-08-26 ENCOUNTER — Emergency Department (HOSPITAL_COMMUNITY): Payer: PPO

## 2023-08-26 DIAGNOSIS — S299XXA Unspecified injury of thorax, initial encounter: Secondary | ICD-10-CM | POA: Diagnosis not present

## 2023-08-26 DIAGNOSIS — M47816 Spondylosis without myelopathy or radiculopathy, lumbar region: Secondary | ICD-10-CM | POA: Diagnosis not present

## 2023-08-26 DIAGNOSIS — S3992XA Unspecified injury of lower back, initial encounter: Secondary | ICD-10-CM | POA: Diagnosis not present

## 2023-08-26 DIAGNOSIS — Z7982 Long term (current) use of aspirin: Secondary | ICD-10-CM | POA: Diagnosis not present

## 2023-08-26 DIAGNOSIS — R079 Chest pain, unspecified: Secondary | ICD-10-CM | POA: Diagnosis not present

## 2023-08-26 DIAGNOSIS — S39012A Strain of muscle, fascia and tendon of lower back, initial encounter: Secondary | ICD-10-CM | POA: Insufficient documentation

## 2023-08-26 DIAGNOSIS — Y92410 Unspecified street and highway as the place of occurrence of the external cause: Secondary | ICD-10-CM | POA: Diagnosis not present

## 2023-08-26 MED ORDER — ACETAMINOPHEN 325 MG PO TABS
650.0000 mg | ORAL_TABLET | Freq: Once | ORAL | Status: AC
  Administered 2023-08-26: 650 mg via ORAL
  Filled 2023-08-26: qty 2
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
## 2023-08-26 NOTE — Discharge Instructions (Addendum)
Apply ice for 30 minutes at a time, 4 times a day.  Take ibuprofen and/or acetaminophen as needed for pain.  If you take both medications together, you will get better pain relief and you get from taking either medication by itself.
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
## 2023-08-31 DIAGNOSIS — F4311 Post-traumatic stress disorder, acute: Secondary | ICD-10-CM | POA: Diagnosis not present

## 2023-08-31 DIAGNOSIS — M545 Low back pain, unspecified: Secondary | ICD-10-CM | POA: Diagnosis not present

## 2023-08-31 DIAGNOSIS — R41841 Cognitive communication deficit: Secondary | ICD-10-CM | POA: Diagnosis not present

## 2023-09-03 DIAGNOSIS — I10 Essential (primary) hypertension: Secondary | ICD-10-CM | POA: Diagnosis not present

## 2023-09-03 DIAGNOSIS — J4520 Mild intermittent asthma, uncomplicated: Secondary | ICD-10-CM | POA: Diagnosis not present

## 2023-09-03 DIAGNOSIS — R059 Cough, unspecified: Secondary | ICD-10-CM | POA: Diagnosis not present

## 2023-09-21 DIAGNOSIS — I10 Essential (primary) hypertension: Secondary | ICD-10-CM | POA: Diagnosis not present

## 2023-09-21 DIAGNOSIS — J4520 Mild intermittent asthma, uncomplicated: Secondary | ICD-10-CM | POA: Diagnosis not present

## 2023-09-30 ENCOUNTER — Other Ambulatory Visit: Payer: Self-pay | Admitting: Sports Medicine

## 2023-09-30 ENCOUNTER — Ambulatory Visit
Admission: RE | Admit: 2023-09-30 | Discharge: 2023-09-30 | Disposition: A | Discharge status: Home / Self Care | Payer: No Typology Code available for payment source | Source: Ambulatory Visit | Attending: Sports Medicine | Admitting: Sports Medicine

## 2023-09-30 DIAGNOSIS — M549 Dorsalgia, unspecified: Secondary | ICD-10-CM

## 2023-10-18 DIAGNOSIS — I10 Essential (primary) hypertension: Secondary | ICD-10-CM | POA: Diagnosis not present

## 2023-10-18 DIAGNOSIS — R7303 Prediabetes: Secondary | ICD-10-CM | POA: Diagnosis not present

## 2023-10-18 DIAGNOSIS — E6609 Other obesity due to excess calories: Secondary | ICD-10-CM | POA: Diagnosis not present

## 2023-10-18 DIAGNOSIS — J4520 Mild intermittent asthma, uncomplicated: Secondary | ICD-10-CM | POA: Diagnosis not present

## 2023-10-18 DIAGNOSIS — F4311 Post-traumatic stress disorder, acute: Secondary | ICD-10-CM | POA: Diagnosis not present

## 2023-10-18 DIAGNOSIS — E559 Vitamin D deficiency, unspecified: Secondary | ICD-10-CM | POA: Diagnosis not present

## 2023-10-19 DIAGNOSIS — Z01419 Encounter for gynecological examination (general) (routine) without abnormal findings: Secondary | ICD-10-CM | POA: Diagnosis not present

## 2023-10-19 DIAGNOSIS — R928 Other abnormal and inconclusive findings on diagnostic imaging of breast: Secondary | ICD-10-CM | POA: Diagnosis not present

## 2023-10-19 DIAGNOSIS — N750 Cyst of Bartholin's gland: Secondary | ICD-10-CM | POA: Diagnosis not present

## 2023-10-26 DIAGNOSIS — M79672 Pain in left foot: Secondary | ICD-10-CM | POA: Diagnosis not present

## 2023-10-26 DIAGNOSIS — S99929A Unspecified injury of unspecified foot, initial encounter: Secondary | ICD-10-CM | POA: Diagnosis not present

## 2023-10-26 DIAGNOSIS — S92215A Nondisplaced fracture of cuboid bone of left foot, initial encounter for closed fracture: Secondary | ICD-10-CM | POA: Diagnosis not present

## 2023-10-26 DIAGNOSIS — R7303 Prediabetes: Secondary | ICD-10-CM | POA: Diagnosis not present

## 2023-11-03 DIAGNOSIS — M79672 Pain in left foot: Secondary | ICD-10-CM | POA: Diagnosis not present

## 2023-11-14 DIAGNOSIS — S40862A Insect bite (nonvenomous) of left upper arm, initial encounter: Secondary | ICD-10-CM | POA: Diagnosis not present

## 2023-11-14 DIAGNOSIS — L03114 Cellulitis of left upper limb: Secondary | ICD-10-CM | POA: Diagnosis not present

## 2023-11-15 ENCOUNTER — Ambulatory Visit
Admission: RE | Admit: 2023-11-15 | Discharge: 2023-11-15 | Disposition: A | Discharge status: Home / Self Care | Source: Ambulatory Visit | Attending: Family Medicine | Admitting: Family Medicine

## 2023-11-15 ENCOUNTER — Other Ambulatory Visit: Payer: Self-pay | Admitting: Family Medicine

## 2023-11-15 ENCOUNTER — Other Ambulatory Visit: Payer: PPO

## 2023-11-15 DIAGNOSIS — N6320 Unspecified lump in the left breast, unspecified quadrant: Secondary | ICD-10-CM

## 2023-11-19 ENCOUNTER — Ambulatory Visit
Admission: RE | Admit: 2023-11-19 | Discharge: 2023-11-19 | Disposition: A | Discharge status: Home / Self Care | Source: Ambulatory Visit | Attending: Family Medicine | Admitting: Family Medicine

## 2023-11-19 ENCOUNTER — Other Ambulatory Visit: Payer: Self-pay | Admitting: Family Medicine

## 2023-11-19 DIAGNOSIS — D242 Benign neoplasm of left breast: Secondary | ICD-10-CM | POA: Diagnosis not present

## 2023-11-19 DIAGNOSIS — N6320 Unspecified lump in the left breast, unspecified quadrant: Secondary | ICD-10-CM

## 2023-11-19 HISTORY — PX: BREAST BIOPSY: SHX20

## 2023-11-22 LAB — SURGICAL PATHOLOGY

## 2023-11-24 ENCOUNTER — Other Ambulatory Visit: Payer: Self-pay | Admitting: Family Medicine

## 2023-11-24 DIAGNOSIS — N6320 Unspecified lump in the left breast, unspecified quadrant: Secondary | ICD-10-CM

## 2023-12-01 DIAGNOSIS — M79672 Pain in left foot: Secondary | ICD-10-CM | POA: Diagnosis not present

## 2023-12-09 DIAGNOSIS — M79672 Pain in left foot: Secondary | ICD-10-CM | POA: Diagnosis not present

## 2023-12-09 DIAGNOSIS — M25572 Pain in left ankle and joints of left foot: Secondary | ICD-10-CM | POA: Diagnosis not present

## 2023-12-15 DIAGNOSIS — Z309 Encounter for contraceptive management, unspecified: Secondary | ICD-10-CM | POA: Diagnosis not present

## 2023-12-20 ENCOUNTER — Encounter (HOSPITAL_COMMUNITY): Payer: Self-pay | Admitting: Obstetrics and Gynecology

## 2023-12-20 NOTE — Progress Notes (Signed)
 Spoke w/ via phone for pre-op interview--- [facility] needs dos----  UPT per anesthesia. Surgeon orders requested 12/20/23.       Lab results------ COVID test -----patient states asymptomatic no test needed Arrive at -------0630 NPO after MN NO Solid Food.   Pre-Surgery Ensure or G2:  Med rec completed Medications to take morning of surgery ----- Albuterol inhaler PRN Diabetic medication -----  GLP1 agonist last dose: GLP1 instructions:  Patient instructed no nail polish to be worn day of surgery Patient instructed to bring photo id and insurance card day of surgery Patient aware to have Driver (ride ) / caregiver    for 24 hours after surgery - Sister - Tourist information centre manager Instructions ----- shower with antibacterial  soap. Pre-Op special Instructions ----- per pt Dr Richardson Dopp said Ok to continue ASA.  Patient verbalized understanding of instructions that were given at this phone interview. Patient denies chest pain, sob, fever, cough at the interview.

## 2023-12-28 ENCOUNTER — Other Ambulatory Visit: Payer: Self-pay | Admitting: Obstetrics and Gynecology

## 2023-12-28 DIAGNOSIS — Z302 Encounter for sterilization: Secondary | ICD-10-CM
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
## 2023-12-28 NOTE — H&P (Deleted)
   The note originally documented on this encounter has been moved the the encounter in which it belongs.
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
## 2023-12-29 ENCOUNTER — Ambulatory Visit (HOSPITAL_BASED_OUTPATIENT_CLINIC_OR_DEPARTMENT_OTHER): Admitting: Anesthesiology

## 2023-12-29 ENCOUNTER — Encounter (HOSPITAL_COMMUNITY)
Admission: RE | Disposition: A | Discharge status: Home / Self Care | Payer: Self-pay | Source: Home / Self Care | Attending: Obstetrics and Gynecology

## 2023-12-29 ENCOUNTER — Ambulatory Visit (HOSPITAL_COMMUNITY): Admitting: Anesthesiology

## 2023-12-29 ENCOUNTER — Other Ambulatory Visit: Payer: Self-pay

## 2023-12-29 ENCOUNTER — Encounter (HOSPITAL_COMMUNITY): Payer: Self-pay | Admitting: Obstetrics and Gynecology

## 2023-12-29 ENCOUNTER — Ambulatory Visit (HOSPITAL_COMMUNITY)
Admission: RE | Admit: 2023-12-29 | Discharge: 2023-12-29 | Disposition: A | Discharge status: Home / Self Care | Payer: Medicaid Other | Attending: Obstetrics and Gynecology | Admitting: Obstetrics and Gynecology

## 2023-12-29 DIAGNOSIS — Z86718 Personal history of other venous thrombosis and embolism: Secondary | ICD-10-CM | POA: Diagnosis not present

## 2023-12-29 DIAGNOSIS — Z01818 Encounter for other preprocedural examination: Secondary | ICD-10-CM

## 2023-12-29 DIAGNOSIS — Z87820 Personal history of traumatic brain injury: Secondary | ICD-10-CM | POA: Insufficient documentation

## 2023-12-29 DIAGNOSIS — Z302 Encounter for sterilization: Secondary | ICD-10-CM | POA: Diagnosis not present

## 2023-12-29 DIAGNOSIS — Z6833 Body mass index (BMI) 33.0-33.9, adult: Secondary | ICD-10-CM | POA: Insufficient documentation

## 2023-12-29 DIAGNOSIS — R7303 Prediabetes: Secondary | ICD-10-CM | POA: Insufficient documentation

## 2023-12-29 DIAGNOSIS — E669 Obesity, unspecified: Secondary | ICD-10-CM | POA: Diagnosis not present

## 2023-12-29 HISTORY — DX: Prediabetes: R73.03

## 2023-12-29 HISTORY — PX: LAPAROSCOPIC BILATERAL SALPINGECTOMY: SHX5889

## 2023-12-29 LAB — CBC
HCT: 40.9 % (ref 36.0–46.0)
Hemoglobin: 13.8 g/dL (ref 12.0–15.0)
MCH: 29.6 pg (ref 26.0–34.0)
MCHC: 33.7 g/dL (ref 30.0–36.0)
MCV: 87.8 fL (ref 80.0–100.0)
Platelets: 177 10*3/uL (ref 150–400)
RBC: 4.66 MIL/uL (ref 3.87–5.11)
RDW: 15.5 % (ref 11.5–15.5)
WBC: 7.6 10*3/uL (ref 4.0–10.5)
nRBC: 0 % (ref 0.0–0.2)

## 2023-12-29 LAB — POCT PREGNANCY, URINE: Preg Test, Ur: NEGATIVE

## 2023-12-29 SURGERY — SALPINGECTOMY, BILATERAL, LAPAROSCOPIC
Anesthesia: General | Site: Abdomen | Laterality: Bilateral

## 2023-12-29 MED ORDER — PROPOFOL 10 MG/ML IV BOLUS
INTRAVENOUS | Status: DC | PRN
  Administered 2023-12-29: 50 mg via INTRAVENOUS
  Administered 2023-12-29: 150 mg via INTRAVENOUS

## 2023-12-29 MED ORDER — OXYCODONE HCL 5 MG PO TABS: 5.0000 mg | ORAL_TABLET | Freq: Once | ORAL | Status: DC | PRN

## 2023-12-29 MED ORDER — HYDROMORPHONE HCL 1 MG/ML IJ SOLN: 0.2500 mg | INTRAMUSCULAR | Status: DC | PRN

## 2023-12-29 MED ORDER — LACTATED RINGERS IV SOLN: INTRAVENOUS | Status: DC

## 2023-12-29 MED ORDER — DEXAMETHASONE SODIUM PHOSPHATE 10 MG/ML IJ SOLN
INTRAMUSCULAR | Status: DC | PRN
  Administered 2023-12-29: 10 mg via INTRAVENOUS

## 2023-12-29 MED ORDER — SODIUM CHLORIDE 0.9 % IV SOLN
2.0000 g | INTRAVENOUS | Status: AC
  Administered 2023-12-29: 2 g via INTRAVENOUS
  Filled 2023-12-29: qty 2

## 2023-12-29 MED ORDER — CHLORHEXIDINE GLUCONATE 0.12 % MT SOLN
OROMUCOSAL | Status: AC
  Filled 2023-12-29: qty 15

## 2023-12-29 MED ORDER — ROCURONIUM BROMIDE 10 MG/ML (PF) SYRINGE
PREFILLED_SYRINGE | INTRAVENOUS | Status: DC | PRN
Start: 2023-12-29 — End: 2023-12-29
  Administered 2023-12-29: 100 mg via INTRAVENOUS

## 2023-12-29 MED ORDER — FENTANYL CITRATE (PF) 250 MCG/5ML IJ SOLN
INTRAMUSCULAR | Status: AC
  Filled 2023-12-29: qty 5

## 2023-12-29 MED ORDER — FENTANYL CITRATE (PF) 250 MCG/5ML IJ SOLN
INTRAMUSCULAR | Status: DC | PRN
  Administered 2023-12-29: 50 ug via INTRAVENOUS
  Administered 2023-12-29: 150 ug via INTRAVENOUS
  Administered 2023-12-29: 50 ug via INTRAVENOUS

## 2023-12-29 MED ORDER — BUPIVACAINE HCL (PF) 0.25 % IJ SOLN
INTRAMUSCULAR | Status: AC
  Filled 2023-12-29: qty 30

## 2023-12-29 MED ORDER — ONDANSETRON HCL 4 MG/2ML IJ SOLN
INTRAMUSCULAR | Status: DC | PRN
Start: 2023-12-29 — End: 2023-12-29
  Administered 2023-12-29: 4 mg via INTRAVENOUS

## 2023-12-29 MED ORDER — STERILE WATER FOR IRRIGATION IR SOLN
Status: DC | PRN
Start: 2023-12-29 — End: 2023-12-29
  Administered 2023-12-29: 1000 mL

## 2023-12-29 MED ORDER — OXYCODONE HCL 5 MG/5ML PO SOLN: 5.0000 mg | Freq: Once | ORAL | Status: DC | PRN

## 2023-12-29 MED ORDER — AMISULPRIDE (ANTIEMETIC) 5 MG/2ML IV SOLN: 10.0000 mg | Freq: Once | INTRAVENOUS | Status: DC | PRN

## 2023-12-29 MED ORDER — PROPOFOL 10 MG/ML IV BOLUS
INTRAVENOUS | Status: AC
  Filled 2023-12-29: qty 20

## 2023-12-29 MED ORDER — ACETAMINOPHEN 500 MG PO TABS
1000.0000 mg | ORAL_TABLET | Freq: Three times a day (TID) | ORAL | 0 refills | Status: AC | PRN
Start: 2023-12-29 — End: ?

## 2023-12-29 MED ORDER — ACETAMINOPHEN 500 MG PO TABS
ORAL_TABLET | ORAL | Status: DC
Start: 2023-12-29 — End: 2023-12-29
  Filled 2023-12-29: qty 2

## 2023-12-29 MED ORDER — KETOROLAC TROMETHAMINE 30 MG/ML IJ SOLN
INTRAMUSCULAR | Status: AC
  Filled 2023-12-29: qty 1

## 2023-12-29 MED ORDER — KETOROLAC TROMETHAMINE 30 MG/ML IJ SOLN
30.0000 mg | Freq: Once | INTRAMUSCULAR | Status: AC | PRN
  Administered 2023-12-29: 30 mg via INTRAVENOUS

## 2023-12-29 MED ORDER — SUGAMMADEX SODIUM 200 MG/2ML IV SOLN
INTRAVENOUS | Status: DC | PRN
Start: 2023-12-29 — End: 2023-12-29
  Administered 2023-12-29: 200 mg via INTRAVENOUS

## 2023-12-29 MED ORDER — MIDAZOLAM HCL 2 MG/2ML IJ SOLN
INTRAMUSCULAR | Status: DC | PRN
  Administered 2023-12-29: 2 mg via INTRAVENOUS

## 2023-12-29 MED ORDER — ORAL CARE MOUTH RINSE: 15.0000 mL | Freq: Once | OROMUCOSAL | Status: AC

## 2023-12-29 MED ORDER — ONDANSETRON HCL 4 MG/2ML IJ SOLN: 4.0000 mg | Freq: Once | INTRAMUSCULAR | Status: DC | PRN

## 2023-12-29 MED ORDER — MEPERIDINE HCL 25 MG/ML IJ SOLN: 6.2500 mg | INTRAMUSCULAR | Status: DC | PRN

## 2023-12-29 MED ORDER — MIDAZOLAM HCL 2 MG/2ML IJ SOLN
INTRAMUSCULAR | Status: AC
  Filled 2023-12-29: qty 2

## 2023-12-29 MED ORDER — ROCURONIUM BROMIDE 10 MG/ML (PF) SYRINGE
PREFILLED_SYRINGE | INTRAVENOUS | Status: AC
  Filled 2023-12-29: qty 10

## 2023-12-29 MED ORDER — LIDOCAINE 2% (20 MG/ML) 5 ML SYRINGE
INTRAMUSCULAR | Status: AC
  Filled 2023-12-29: qty 5

## 2023-12-29 MED ORDER — IBUPROFEN 800 MG PO TABS: 800.0000 mg | ORAL_TABLET | Freq: Three times a day (TID) | ORAL | 0 refills | Status: AC | PRN

## 2023-12-29 MED ORDER — SCOPOLAMINE 1 MG/3DAYS TD PT72: 1.0000 | MEDICATED_PATCH | TRANSDERMAL | Status: DC

## 2023-12-29 MED ORDER — POVIDONE-IODINE 10 % EX SWAB: 2.0000 | Freq: Once | CUTANEOUS | Status: DC

## 2023-12-29 MED ORDER — ONDANSETRON HCL 4 MG/2ML IJ SOLN
INTRAMUSCULAR | Status: AC
  Filled 2023-12-29: qty 2

## 2023-12-29 MED ORDER — OXYCODONE HCL 5 MG PO TABS: 5.0000 mg | ORAL_TABLET | Freq: Four times a day (QID) | ORAL | 0 refills | Status: AC | PRN

## 2023-12-29 MED ORDER — DEXAMETHASONE SODIUM PHOSPHATE 10 MG/ML IJ SOLN
INTRAMUSCULAR | Status: AC
  Filled 2023-12-29: qty 1

## 2023-12-29 MED ORDER — CHLORHEXIDINE GLUCONATE 0.12 % MT SOLN
15.0000 mL | Freq: Once | OROMUCOSAL | Status: AC
  Administered 2023-12-29: 15 mL via OROMUCOSAL

## 2023-12-29 MED ORDER — BUPIVACAINE HCL (PF) 0.25 % IJ SOLN
INTRAMUSCULAR | Status: DC | PRN
  Administered 2023-12-29: 30 mL

## 2023-12-29 MED ORDER — ACETAMINOPHEN 500 MG PO TABS
1000.0000 mg | ORAL_TABLET | ORAL | Status: AC
  Administered 2023-12-29: 1000 mg via ORAL

## 2023-12-29 MED ORDER — LIDOCAINE 2% (20 MG/ML) 5 ML SYRINGE
INTRAMUSCULAR | Status: DC | PRN
  Administered 2023-12-29: 80 mg via INTRAVENOUS

## 2023-12-29 SURGICAL SUPPLY — 33 items
APPLICATOR ARISTA FLEXITIP XL (MISCELLANEOUS) IMPLANT
CABLE HIGH FREQUENCY MONO STRZ (ELECTRODE) IMPLANT
DERMABOND ADVANCED .7 DNX6 (GAUZE/BANDAGES/DRESSINGS) IMPLANT
DRAPE SURG IRRIG POUCH 19X23 (DRAPES) ×2 IMPLANT
DRSG OPSITE POSTOP 3X4 (GAUZE/BANDAGES/DRESSINGS) IMPLANT
GLOVE BIOGEL PI IND STRL 6.5 (GLOVE) ×2 IMPLANT
GLOVE BIOGEL PI IND STRL 7.0 (GLOVE) ×4 IMPLANT
GLOVE SURG ENC TEXT LTX SZ6.5 (GLOVE) ×4 IMPLANT
GOWN STRL REUS W/ TWL LRG LVL3 (GOWN DISPOSABLE) ×4 IMPLANT
HEMOSTAT ARISTA ABSORB 3G PWDR (HEMOSTASIS) IMPLANT
IRRIG SUCT STRYKERFLOW 2 WTIP (MISCELLANEOUS) IMPLANT
IRRIGATION SUCT STRKRFLW 2 WTP (MISCELLANEOUS) IMPLANT
KIT PINK PAD W/HEAD ARE REST (MISCELLANEOUS) ×1 IMPLANT
KIT PINK PAD W/HEAD ARM REST (MISCELLANEOUS) ×2 IMPLANT
KIT TURNOVER KIT B (KITS) ×2 IMPLANT
LIGASURE VESSEL 5MM BLUNT TIP (ELECTROSURGICAL) IMPLANT
NS IRRIG 1000ML POUR BTL (IV SOLUTION) ×2 IMPLANT
PACK LAPAROSCOPY BASIN (CUSTOM PROCEDURE TRAY) ×2 IMPLANT
POUCH LAPAROSCOPIC INSTRUMENT (MISCELLANEOUS) ×2 IMPLANT
SET TUBE SMOKE EVAC HIGH FLOW (TUBING) ×2 IMPLANT
SHEARS HARMONIC ACE PLUS 36CM (ENDOMECHANICALS) IMPLANT
SLEEVE ADV FIXATION 5X100MM (TROCAR) ×2 IMPLANT
SOL ELECTROSURG ANTI STICK (MISCELLANEOUS) IMPLANT
SOLUTION ELECTROSURG ANTI STCK (MISCELLANEOUS) IMPLANT
SUT VICRYL 0 UR6 27IN ABS (SUTURE) IMPLANT
SUT VICRYL 4-0 PS2 18IN ABS (SUTURE) ×2 IMPLANT
SYS BAG RETRIEVAL 10MM (BASKET) IMPLANT
SYSTEM BAG RETRIEVAL 10MM (BASKET) IMPLANT
TOWEL GREEN STERILE FF (TOWEL DISPOSABLE) ×4 IMPLANT
TRAY FOLEY W/BAG SLVR 14FR (SET/KITS/TRAYS/PACK) ×2 IMPLANT
TROCAR ADV FIXATION 11X100MM (TROCAR) IMPLANT
TROCAR ADV FIXATION 5X100MM (TROCAR) ×2 IMPLANT
WARMER LAPAROSCOPE (MISCELLANEOUS) ×2 IMPLANT

## 2023-12-29 NOTE — Discharge Instructions (Signed)

## 2023-12-29 NOTE — H&P (Signed)
 Date of Initial H&P: 12/28/2023  History reviewed, patient examined, no change in status, stable for surgery.

## 2023-12-29 NOTE — Op Note (Signed)
 12/29/2023  9:44 AM  PATIENT:  Tiffany Hurley  48 y.o. female  PRE-OPERATIVE DIAGNOSIS:  Encounter for Sterilization  POST-OPERATIVE DIAGNOSIS:  Encounter for Sterilization  PROCEDURE:  Procedure(s): SALPINGECTOMY, BILATERAL, LAPAROSCOPIC (Bilateral)  SURGEON:  Surgeons and Role:    Arlee Lace, MD - Primary  PHYSICIAN ASSISTANT: None  ASSISTANTS: Lori Rondo RNFA assisted. An experienced assistant was required given the standard of surgical care given the complexity of the case.  This assistant was needed for exposure, dissection, suctioning, retraction, instrument exchange, assisting with delivery with administration of fundal pressure, and for overall help during the procedure.    ANESTHESIA:   general  EBL:  5 mL   BLOOD ADMINISTERED:none  DRAINS: Urinary Catheter (Foley)   LOCAL MEDICATIONS USED:  MARCAINE     SPECIMEN:  Source of Specimen:  bilateral fallopian tubes   DISPOSITION OF SPECIMEN:  PATHOLOGY  COUNTS:  YES  TOURNIQUET:  * No tourniquets in log *  DICTATION: .Note written in EPIC  PLAN OF CARE: Discharge to home after PACU  PATIENT DISPOSITION:  PACU - hemodynamically stable.   Delay start of Pharmacological VTE agent (>24hrs) due to surgical blood loss or risk of bleeding: not applicable  Findings: normal appearing external genitalia vaginal mucosa and cervix. Normal appearing uterus ovaries and fallopian tubes.   Description of each procedure:  After informed consent the patient was taken to the operating room and placed in dorsal supine position where general endotracheal anesthesia was administered and found to be adequate.  She was placed in dorsal lithotomy position.  She was prepped and draped in the usual sterile fashion. A timeout was called and the procedure confirmed. A Hulka uterine manipulator with and a Foley catheter were placed.  The abdomen was entered through a 5mm umbilical incision under direct visualization and a  pneumoperitoneum was established.  Right and left lower quadrant ports were placed under visualization.  The entry point was visualized from an inferior port and atraumatic entry confirmed. The left fallopian tube was removed from its attachment to the normal-appearing left ovary starting at the fimbria and divided along the mesosalpinx to the level of the uterus. The tube was desiccated thoroughly and divided from the uterus. The specimen was removed.  This process was repeated on the contralateral side. Hemostasis confirmed. Bilateral lower quadrant ports withdrawn. The pneumoperitoneum was reduced completely and the camera port withdrawn under visualization. The skin was closed with 4-0 Vicryl in subcuticular fashion. The Hulka tenaculum was removed and the cervix made hemostatic with pressure   The patient was returned to dorsal supine position, awakened and extubated in the OR having appeared to tolerate the procedure well.  All sponge, needle, and instrument counts were correct x 2 at the end of the case.

## 2023-12-29 NOTE — Transfer of Care (Signed)
 Immediate Anesthesia Transfer of Care Note  Patient: Tiffany Hurley  Procedure(s) Performed: SALPINGECTOMY, BILATERAL, LAPAROSCOPIC (Bilateral: Abdomen)  Patient Location: PACU  Anesthesia Type:General  Level of Consciousness: awake and alert   Airway & Oxygen Therapy: Patient Spontanous Breathing  Post-op Assessment: Report given to RN and Post -op Vital signs reviewed and stable  Post vital signs: Reviewed and stable  Last Vitals:  Vitals Value Taken Time  BP    Temp 36.6 C 12/29/23 0957  Pulse 92 12/29/23 0957  Resp 16 12/29/23 0957  SpO2 100 % 12/29/23 0957    Last Pain:  Vitals:   12/29/23 0724  TempSrc: Oral  PainSc: 0-No pain      Patients Stated Pain Goal: 6 (12/29/23 0724)  Complications: No notable events documented.

## 2023-12-29 NOTE — Anesthesia Procedure Notes (Signed)
 Procedure Name: Intubation Date/Time: 12/29/2023 8:55 AM  Performed by: Ballard Levels, CRNAPre-anesthesia Checklist: Patient identified, Emergency Drugs available, Suction available and Patient being monitored Patient Re-evaluated:Patient Re-evaluated prior to induction Oxygen Delivery Method: Circle System Utilized Preoxygenation: Pre-oxygenation with 100% oxygen Induction Type: IV induction Ventilation: Mask ventilation without difficulty Laryngoscope Size: Miller and 3 Grade View: Grade I Tube type: Oral Number of attempts: 1 Airway Equipment and Method: Stylet and Oral airway Placement Confirmation: ETT inserted through vocal cords under direct vision, positive ETCO2 and breath sounds checked- equal and bilateral Secured at: 21 cm Tube secured with: Tape Dental Injury: Teeth and Oropharynx as per pre-operative assessment  Comments: Fever blister right upper lip.

## 2023-12-29 NOTE — Discharge Instr - Supplementary Instructions (Signed)
Take short walks around the house every 1-2 hours while awake.

## 2023-12-30 ENCOUNTER — Encounter (HOSPITAL_COMMUNITY): Payer: Self-pay | Admitting: Obstetrics and Gynecology

## 2023-12-30 LAB — SURGICAL PATHOLOGY

## 2023-12-30 NOTE — Anesthesia Postprocedure Evaluation (Signed)
 Anesthesia Post Note  Patient: Evelyna Folker  Procedure(s) Performed: SALPINGECTOMY, BILATERAL, LAPAROSCOPIC (Bilateral: Abdomen)     Patient location during evaluation: PACU Anesthesia Type: General Level of consciousness: awake and alert Pain management: pain level controlled Vital Signs Assessment: post-procedure vital signs reviewed and stable Respiratory status: spontaneous breathing, nonlabored ventilation, respiratory function stable and patient connected to nasal cannula oxygen Cardiovascular status: blood pressure returned to baseline and stable Postop Assessment: no apparent nausea or vomiting Anesthetic complications: no  No notable events documented.  Last Vitals:  Vitals:   12/29/23 1025 12/29/23 1032  BP:  122/89  Pulse: 92 78  Resp: 18 14  Temp:  36.7 C  SpO2: 98% 100%    Last Pain:  Vitals:   12/29/23 0724  TempSrc: Oral  PainSc: 0-No pain   Pain Goal: Patients Stated Pain Goal: 6 (12/29/23 0724)                 Myrla Asp L Celedonio Sortino

## 2024-01-19 DIAGNOSIS — M79672 Pain in left foot: Secondary | ICD-10-CM | POA: Diagnosis not present

## 2024-01-24 DIAGNOSIS — R7303 Prediabetes: Secondary | ICD-10-CM | POA: Diagnosis not present

## 2024-01-24 DIAGNOSIS — E6609 Other obesity due to excess calories: Secondary | ICD-10-CM | POA: Diagnosis not present

## 2024-01-24 DIAGNOSIS — Z6833 Body mass index (BMI) 33.0-33.9, adult: Secondary | ICD-10-CM | POA: Diagnosis not present

## 2024-01-24 DIAGNOSIS — M25579 Pain in unspecified ankle and joints of unspecified foot: Secondary | ICD-10-CM | POA: Diagnosis not present

## 2024-01-24 DIAGNOSIS — S99929A Unspecified injury of unspecified foot, initial encounter: Secondary | ICD-10-CM | POA: Diagnosis not present

## 2024-02-28 DIAGNOSIS — M79672 Pain in left foot: Secondary | ICD-10-CM | POA: Diagnosis not present

## 2024-03-31 ENCOUNTER — Encounter: Payer: Self-pay | Admitting: Advanced Practice Midwife

## 2024-04-05 ENCOUNTER — Encounter

## 2024-05-05 DIAGNOSIS — W503XXA Accidental bite by another person, initial encounter: Secondary | ICD-10-CM | POA: Diagnosis not present

## 2024-05-05 DIAGNOSIS — M25559 Pain in unspecified hip: Secondary | ICD-10-CM | POA: Diagnosis not present

## 2024-05-11 LAB — AMB RESULTS CONSOLE CBG: Glucose: 108

## 2024-05-17 ENCOUNTER — Ambulatory Visit
Admission: RE | Admit: 2024-05-17 | Discharge: 2024-05-17 | Disposition: A | Discharge status: Home / Self Care | Source: Ambulatory Visit | Attending: Family Medicine | Admitting: Family Medicine

## 2024-05-17 ENCOUNTER — Other Ambulatory Visit: Payer: Self-pay | Admitting: Family Medicine

## 2024-05-17 DIAGNOSIS — N6320 Unspecified lump in the left breast, unspecified quadrant: Secondary | ICD-10-CM

## 2024-05-17 DIAGNOSIS — R928 Other abnormal and inconclusive findings on diagnostic imaging of breast: Secondary | ICD-10-CM | POA: Diagnosis not present

## 2024-05-23 ENCOUNTER — Ambulatory Visit
Admission: RE | Admit: 2024-05-23 | Discharge: 2024-05-23 | Disposition: A | Discharge status: Home / Self Care | Source: Ambulatory Visit | Attending: Family Medicine | Admitting: Family Medicine

## 2024-05-23 ENCOUNTER — Other Ambulatory Visit: Payer: Self-pay | Admitting: Family Medicine

## 2024-05-23 DIAGNOSIS — I10 Essential (primary) hypertension: Secondary | ICD-10-CM | POA: Diagnosis not present

## 2024-05-23 DIAGNOSIS — M25551 Pain in right hip: Secondary | ICD-10-CM

## 2024-05-26 DIAGNOSIS — Z23 Encounter for immunization: Secondary | ICD-10-CM | POA: Diagnosis not present
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
## 2024-06-13 ENCOUNTER — Other Ambulatory Visit: Payer: Self-pay

## 2024-06-13 ENCOUNTER — Ambulatory Visit: Attending: Family Medicine

## 2024-06-13 DIAGNOSIS — M25551 Pain in right hip: Secondary | ICD-10-CM | POA: Insufficient documentation

## 2024-06-13 DIAGNOSIS — M6281 Muscle weakness (generalized): Secondary | ICD-10-CM | POA: Insufficient documentation

## 2024-06-26 NOTE — Progress Notes (Addendum)
 Pt was seen at 05/11/24 screening event where her bp was 107/74 and her blood sugar was 108. Chart review indicates that pt has a PCP listed as Benjamine Basques MD, insured through [facility] and has no SDOH needs. Pt has a future Radiology appt on 11/16/2024 and had her last office visit with her PCP on 01/24/2024. No follow up to be scheduled per health equity protocol.

## 2024-11-16 ENCOUNTER — Other Ambulatory Visit
